# Patient Record
Sex: Female | Born: 1955 | Race: White | Hispanic: No | Marital: Married | Smoking: Former smoker
Health system: Southern US, Community
[De-identification: ages and names within clinical notes are randomized; demographics above are authoritative.]

## PROBLEM LIST (undated history)

## (undated) DIAGNOSIS — R112 Nausea with vomiting, unspecified: Secondary | ICD-10-CM

## (undated) DIAGNOSIS — K2950 Unspecified chronic gastritis without bleeding: Secondary | ICD-10-CM

## (undated) DIAGNOSIS — J189 Pneumonia, unspecified organism: Secondary | ICD-10-CM

## (undated) DIAGNOSIS — K449 Diaphragmatic hernia without obstruction or gangrene: Secondary | ICD-10-CM

## (undated) DIAGNOSIS — R110 Nausea: Secondary | ICD-10-CM

## (undated) DIAGNOSIS — F419 Anxiety disorder, unspecified: Secondary | ICD-10-CM

## (undated) DIAGNOSIS — T8859XA Other complications of anesthesia, initial encounter: Secondary | ICD-10-CM

## (undated) DIAGNOSIS — K649 Unspecified hemorrhoids: Secondary | ICD-10-CM

## (undated) DIAGNOSIS — I499 Cardiac arrhythmia, unspecified: Secondary | ICD-10-CM

## (undated) DIAGNOSIS — K648 Other hemorrhoids: Secondary | ICD-10-CM

## (undated) DIAGNOSIS — F411 Generalized anxiety disorder: Secondary | ICD-10-CM

## (undated) DIAGNOSIS — I209 Angina pectoris, unspecified: Secondary | ICD-10-CM

## (undated) DIAGNOSIS — R002 Palpitations: Secondary | ICD-10-CM

## (undated) DIAGNOSIS — T7840XA Allergy, unspecified, initial encounter: Secondary | ICD-10-CM

## (undated) DIAGNOSIS — T4145XA Adverse effect of unspecified anesthetic, initial encounter: Secondary | ICD-10-CM

## (undated) DIAGNOSIS — I10 Essential (primary) hypertension: Secondary | ICD-10-CM

## (undated) DIAGNOSIS — R0602 Shortness of breath: Secondary | ICD-10-CM

## (undated) DIAGNOSIS — J40 Bronchitis, not specified as acute or chronic: Secondary | ICD-10-CM

## (undated) DIAGNOSIS — Z9889 Other specified postprocedural states: Secondary | ICD-10-CM

## (undated) DIAGNOSIS — K219 Gastro-esophageal reflux disease without esophagitis: Secondary | ICD-10-CM

## (undated) HISTORY — DX: Palpitations: R00.2

## (undated) HISTORY — DX: Unspecified hemorrhoids: K64.9

## (undated) HISTORY — DX: Generalized anxiety disorder: F41.1

## (undated) HISTORY — DX: Allergy, unspecified, initial encounter: T78.40XA

## (undated) HISTORY — DX: Diaphragmatic hernia without obstruction or gangrene: K44.9

## (undated) HISTORY — PX: COLONOSCOPY: SHX174

## (undated) HISTORY — DX: Nausea: R11.0

## (undated) HISTORY — PX: ABDOMINAL HYSTERECTOMY: SHX81

## (undated) HISTORY — PX: OOPHORECTOMY: SHX86

## (undated) HISTORY — DX: Unspecified chronic gastritis without bleeding: K29.50

## (undated) HISTORY — DX: Gastro-esophageal reflux disease without esophagitis: K21.9

## (undated) HISTORY — DX: Other hemorrhoids: K64.8

---

## 1979-06-16 HISTORY — PX: DILATION AND CURETTAGE OF UTERUS: SHX78

## 1987-09-29 ENCOUNTER — Encounter (INDEPENDENT_AMBULATORY_CARE_PROVIDER_SITE_OTHER): Payer: Self-pay | Admitting: Gastroenterology

## 1989-06-15 HISTORY — PX: NASAL SEPTUM SURGERY: SHX37

## 2000-03-07 ENCOUNTER — Encounter: Payer: Self-pay | Admitting: Obstetrics and Gynecology

## 2000-03-07 ENCOUNTER — Encounter: Admission: RE | Admit: 2000-03-07 | Discharge: 2000-03-07 | Payer: Self-pay | Admitting: Obstetrics and Gynecology

## 2001-03-11 ENCOUNTER — Encounter: Payer: Self-pay | Admitting: Obstetrics and Gynecology

## 2001-03-11 ENCOUNTER — Encounter: Admission: RE | Admit: 2001-03-11 | Discharge: 2001-03-11 | Payer: Self-pay | Admitting: Obstetrics and Gynecology

## 2002-07-17 ENCOUNTER — Encounter: Admission: RE | Admit: 2002-07-17 | Discharge: 2002-07-17 | Payer: Self-pay | Admitting: Obstetrics and Gynecology

## 2002-07-17 ENCOUNTER — Encounter: Payer: Self-pay | Admitting: Obstetrics and Gynecology

## 2003-07-22 ENCOUNTER — Encounter: Admission: RE | Admit: 2003-07-22 | Discharge: 2003-07-22 | Payer: Self-pay | Admitting: Obstetrics and Gynecology

## 2003-07-22 ENCOUNTER — Encounter: Payer: Self-pay | Admitting: Obstetrics and Gynecology

## 2004-10-25 ENCOUNTER — Ambulatory Visit: Payer: Self-pay | Admitting: Gastroenterology

## 2004-10-26 ENCOUNTER — Ambulatory Visit: Payer: Self-pay | Admitting: Gastroenterology

## 2004-11-15 ENCOUNTER — Ambulatory Visit (HOSPITAL_COMMUNITY): Admission: RE | Admit: 2004-11-15 | Discharge: 2004-11-15 | Payer: Self-pay | Admitting: Obstetrics and Gynecology

## 2006-01-23 ENCOUNTER — Ambulatory Visit (HOSPITAL_COMMUNITY): Admission: RE | Admit: 2006-01-23 | Discharge: 2006-01-23 | Payer: Self-pay | Admitting: Obstetrics and Gynecology

## 2006-02-05 ENCOUNTER — Encounter: Admission: RE | Admit: 2006-02-05 | Discharge: 2006-02-05 | Payer: Self-pay | Admitting: Obstetrics and Gynecology

## 2007-03-06 ENCOUNTER — Ambulatory Visit: Payer: Self-pay | Admitting: Internal Medicine

## 2007-03-06 LAB — CONVERTED CEMR LAB
ALT: 29 units/L (ref 0–40)
AST: 22 units/L (ref 0–37)
Albumin: 4.6 g/dL (ref 3.5–5.2)
Alkaline Phosphatase: 65 units/L (ref 39–117)
BUN: 18 mg/dL (ref 6–23)
Basophils Absolute: 0 10*3/uL (ref 0.0–0.1)
Basophils Relative: 0.8 % (ref 0.0–1.0)
Bilirubin, Direct: 0.1 mg/dL (ref 0.0–0.3)
CO2: 30 meq/L (ref 19–32)
Calcium: 9.6 mg/dL (ref 8.4–10.5)
Chloride: 107 meq/L (ref 96–112)
Creatinine, Ser: 0.8 mg/dL (ref 0.4–1.2)
Eosinophils Absolute: 0 10*3/uL (ref 0.0–0.6)
Eosinophils Relative: 1.2 % (ref 0.0–5.0)
GFR calc Af Amer: 97 mL/min
GFR calc non Af Amer: 80 mL/min
Glucose, Bld: 103 mg/dL — ABNORMAL HIGH (ref 70–99)
HCT: 41 % (ref 36.0–46.0)
Hemoglobin: 13.9 g/dL (ref 12.0–15.0)
Lymphocytes Relative: 32.9 % (ref 12.0–46.0)
MCHC: 33.8 g/dL (ref 30.0–36.0)
MCV: 85.1 fL (ref 78.0–100.0)
Monocytes Absolute: 0.3 10*3/uL (ref 0.2–0.7)
Monocytes Relative: 7.5 % (ref 3.0–11.0)
Neutro Abs: 2.3 10*3/uL (ref 1.4–7.7)
Neutrophils Relative %: 57.6 % (ref 43.0–77.0)
Platelets: 197 10*3/uL (ref 150–400)
Potassium: 4.4 meq/L (ref 3.5–5.1)
RBC: 4.82 M/uL (ref 3.87–5.11)
RDW: 12.6 % (ref 11.5–14.6)
Sodium: 143 meq/L (ref 135–145)
TSH: 2.11 microintl units/mL (ref 0.35–5.50)
Total Bilirubin: 0.6 mg/dL (ref 0.3–1.2)
Total Protein: 7.8 g/dL (ref 6.0–8.3)
WBC: 3.9 10*3/uL — ABNORMAL LOW (ref 4.5–10.5)

## 2007-03-07 ENCOUNTER — Ambulatory Visit: Payer: Self-pay | Admitting: Internal Medicine

## 2007-06-11 ENCOUNTER — Ambulatory Visit (HOSPITAL_COMMUNITY): Admission: RE | Admit: 2007-06-11 | Discharge: 2007-06-11 | Payer: Self-pay | Admitting: Obstetrics and Gynecology

## 2007-10-16 LAB — CONVERTED CEMR LAB: Pap Smear: NORMAL

## 2008-02-19 ENCOUNTER — Emergency Department (HOSPITAL_COMMUNITY): Admission: EM | Admit: 2008-02-19 | Discharge: 2008-02-20 | Payer: Self-pay | Admitting: Emergency Medicine

## 2008-03-03 ENCOUNTER — Ambulatory Visit: Payer: Self-pay | Admitting: Internal Medicine

## 2008-03-03 DIAGNOSIS — I10 Essential (primary) hypertension: Secondary | ICD-10-CM

## 2008-03-03 DIAGNOSIS — J309 Allergic rhinitis, unspecified: Secondary | ICD-10-CM | POA: Insufficient documentation

## 2008-03-03 HISTORY — DX: Essential (primary) hypertension: I10

## 2008-03-03 HISTORY — DX: Allergic rhinitis, unspecified: J30.9

## 2008-03-04 ENCOUNTER — Telehealth: Payer: Self-pay | Admitting: Internal Medicine

## 2008-03-04 ENCOUNTER — Ambulatory Visit: Payer: Self-pay | Admitting: Internal Medicine

## 2008-03-25 ENCOUNTER — Emergency Department (HOSPITAL_COMMUNITY): Admission: EM | Admit: 2008-03-25 | Discharge: 2008-03-26 | Payer: Self-pay | Admitting: Emergency Medicine

## 2008-03-26 ENCOUNTER — Ambulatory Visit: Payer: Self-pay | Admitting: Internal Medicine

## 2008-04-02 ENCOUNTER — Ambulatory Visit: Payer: Self-pay | Admitting: Internal Medicine

## 2008-04-05 ENCOUNTER — Telehealth: Payer: Self-pay | Admitting: Internal Medicine

## 2008-04-07 ENCOUNTER — Telehealth: Payer: Self-pay | Admitting: Internal Medicine

## 2008-04-30 ENCOUNTER — Telehealth: Payer: Self-pay | Admitting: Internal Medicine

## 2008-12-16 ENCOUNTER — Observation Stay (HOSPITAL_COMMUNITY): Admission: EM | Admit: 2008-12-16 | Discharge: 2008-12-17 | Payer: Self-pay | Admitting: Emergency Medicine

## 2008-12-16 ENCOUNTER — Ambulatory Visit: Payer: Self-pay | Admitting: Internal Medicine

## 2008-12-23 ENCOUNTER — Ambulatory Visit: Payer: Self-pay

## 2008-12-30 ENCOUNTER — Ambulatory Visit: Payer: Self-pay | Admitting: Internal Medicine

## 2008-12-30 ENCOUNTER — Encounter: Payer: Self-pay | Admitting: Internal Medicine

## 2009-02-02 ENCOUNTER — Encounter: Payer: Self-pay | Admitting: Internal Medicine

## 2009-02-02 ENCOUNTER — Ambulatory Visit: Payer: Self-pay | Admitting: Internal Medicine

## 2009-02-02 ENCOUNTER — Ambulatory Visit (HOSPITAL_COMMUNITY): Admission: RE | Admit: 2009-02-02 | Discharge: 2009-02-02 | Payer: Self-pay | Admitting: Obstetrics and Gynecology

## 2009-02-02 DIAGNOSIS — R002 Palpitations: Secondary | ICD-10-CM | POA: Insufficient documentation

## 2009-02-02 HISTORY — DX: Palpitations: R00.2

## 2009-02-07 ENCOUNTER — Telehealth: Payer: Self-pay | Admitting: Internal Medicine

## 2009-02-08 ENCOUNTER — Telehealth: Payer: Self-pay | Admitting: Internal Medicine

## 2009-02-09 ENCOUNTER — Telehealth: Payer: Self-pay | Admitting: Internal Medicine

## 2009-05-12 ENCOUNTER — Telehealth: Payer: Self-pay | Admitting: Internal Medicine

## 2009-05-26 ENCOUNTER — Ambulatory Visit (HOSPITAL_COMMUNITY): Admission: RE | Admit: 2009-05-26 | Discharge: 2009-05-26 | Payer: Self-pay | Admitting: Internal Medicine

## 2009-06-16 ENCOUNTER — Ambulatory Visit: Payer: Self-pay | Admitting: Internal Medicine

## 2009-06-16 ENCOUNTER — Telehealth: Payer: Self-pay | Admitting: Internal Medicine

## 2009-06-16 DIAGNOSIS — K649 Unspecified hemorrhoids: Secondary | ICD-10-CM | POA: Insufficient documentation

## 2009-06-16 DIAGNOSIS — R1011 Right upper quadrant pain: Secondary | ICD-10-CM

## 2009-06-16 DIAGNOSIS — R110 Nausea: Secondary | ICD-10-CM

## 2009-06-16 HISTORY — DX: Right upper quadrant pain: R10.11

## 2009-06-16 HISTORY — DX: Nausea: R11.0

## 2009-06-16 HISTORY — DX: Unspecified hemorrhoids: K64.9

## 2009-06-17 LAB — CONVERTED CEMR LAB
ALT: 22 units/L (ref 0–35)
AST: 16 units/L (ref 0–37)
Albumin: 4.6 g/dL (ref 3.5–5.2)
Alkaline Phosphatase: 65 units/L (ref 39–117)
Basophils Absolute: 0 10*3/uL (ref 0.0–0.1)
Basophils Relative: 0.6 % (ref 0.0–3.0)
Bilirubin, Direct: 0.1 mg/dL (ref 0.0–0.3)
Eosinophils Absolute: 0.1 10*3/uL (ref 0.0–0.7)
Eosinophils Relative: 1.3 % (ref 0.0–5.0)
HCT: 37.8 % (ref 36.0–46.0)
Hemoglobin: 13.3 g/dL (ref 12.0–15.0)
Lymphocytes Relative: 26 % (ref 12.0–46.0)
Lymphs Abs: 1.2 10*3/uL (ref 0.7–4.0)
MCHC: 35.3 g/dL (ref 30.0–36.0)
MCV: 88.1 fL (ref 78.0–100.0)
Monocytes Absolute: 0.3 10*3/uL (ref 0.1–1.0)
Monocytes Relative: 5.6 % (ref 3.0–12.0)
Neutro Abs: 2.9 10*3/uL (ref 1.4–7.7)
Neutrophils Relative %: 66.5 % (ref 43.0–77.0)
Platelets: 184 10*3/uL (ref 150.0–400.0)
RBC: 4.29 M/uL (ref 3.87–5.11)
RDW: 12.1 % (ref 11.5–14.6)
Total Bilirubin: 0.9 mg/dL (ref 0.3–1.2)
Total Protein: 7.7 g/dL (ref 6.0–8.3)
WBC: 4.5 10*3/uL (ref 4.5–10.5)

## 2009-06-23 ENCOUNTER — Ambulatory Visit: Payer: Self-pay | Admitting: Internal Medicine

## 2009-06-23 ENCOUNTER — Encounter: Payer: Self-pay | Admitting: Internal Medicine

## 2009-06-27 ENCOUNTER — Encounter: Payer: Self-pay | Admitting: Internal Medicine

## 2009-07-28 ENCOUNTER — Encounter (INDEPENDENT_AMBULATORY_CARE_PROVIDER_SITE_OTHER): Payer: Self-pay | Admitting: *Deleted

## 2009-11-01 ENCOUNTER — Telehealth: Payer: Self-pay | Admitting: Internal Medicine

## 2009-11-03 ENCOUNTER — Ambulatory Visit: Payer: Self-pay | Admitting: Internal Medicine

## 2009-11-03 LAB — CONVERTED CEMR LAB
ALT: 16 units/L (ref 0–35)
AST: 15 units/L (ref 0–37)
Albumin: 4.5 g/dL (ref 3.5–5.2)
Alkaline Phosphatase: 57 units/L (ref 39–117)
BUN: 20 mg/dL (ref 6–23)
Basophils Absolute: 0 10*3/uL (ref 0.0–0.1)
Basophils Relative: 0.3 % (ref 0.0–3.0)
Bilirubin, Direct: 0.1 mg/dL (ref 0.0–0.3)
Creatinine, Ser: 0.9 mg/dL (ref 0.4–1.2)
Eosinophils Absolute: 0.1 10*3/uL (ref 0.0–0.7)
Eosinophils Relative: 2.3 % (ref 0.0–5.0)
HCT: 36.1 % (ref 36.0–46.0)
Hemoglobin: 11.9 g/dL — ABNORMAL LOW (ref 12.0–15.0)
Lymphocytes Relative: 28.4 % (ref 12.0–46.0)
Lymphs Abs: 1.1 10*3/uL (ref 0.7–4.0)
MCHC: 32.9 g/dL (ref 30.0–36.0)
MCV: 89.2 fL (ref 78.0–100.0)
Monocytes Absolute: 0.4 10*3/uL (ref 0.1–1.0)
Monocytes Relative: 8.9 % (ref 3.0–12.0)
Neutro Abs: 2.4 10*3/uL (ref 1.4–7.7)
Neutrophils Relative %: 60.1 % (ref 43.0–77.0)
Platelets: 169 10*3/uL (ref 150.0–400.0)
RBC: 4.04 M/uL (ref 3.87–5.11)
RDW: 12.4 % (ref 11.5–14.6)
Total Bilirubin: 0.8 mg/dL (ref 0.3–1.2)
Total Protein: 7.4 g/dL (ref 6.0–8.3)
WBC: 4 10*3/uL — ABNORMAL LOW (ref 4.5–10.5)

## 2009-11-04 ENCOUNTER — Ambulatory Visit: Payer: Self-pay | Admitting: Cardiology

## 2009-11-07 ENCOUNTER — Ambulatory Visit: Payer: Self-pay | Admitting: Internal Medicine

## 2009-12-05 ENCOUNTER — Encounter: Payer: Self-pay | Admitting: Internal Medicine

## 2009-12-07 ENCOUNTER — Ambulatory Visit: Payer: Self-pay | Admitting: Internal Medicine

## 2009-12-07 DIAGNOSIS — R079 Chest pain, unspecified: Secondary | ICD-10-CM | POA: Insufficient documentation

## 2009-12-07 HISTORY — DX: Chest pain, unspecified: R07.9

## 2009-12-15 ENCOUNTER — Ambulatory Visit: Payer: Self-pay | Admitting: Internal Medicine

## 2010-01-11 ENCOUNTER — Telehealth: Payer: Self-pay | Admitting: Internal Medicine

## 2010-02-07 ENCOUNTER — Ambulatory Visit (HOSPITAL_COMMUNITY): Admission: RE | Admit: 2010-02-07 | Discharge: 2010-02-07 | Payer: Self-pay | Admitting: Obstetrics and Gynecology

## 2010-04-04 ENCOUNTER — Ambulatory Visit: Payer: Self-pay | Admitting: Internal Medicine

## 2010-04-04 DIAGNOSIS — IMO0001 Reserved for inherently not codable concepts without codable children: Secondary | ICD-10-CM | POA: Insufficient documentation

## 2010-04-04 DIAGNOSIS — M791 Myalgia, unspecified site: Secondary | ICD-10-CM

## 2010-04-04 HISTORY — DX: Myalgia, unspecified site: M79.10

## 2010-10-24 ENCOUNTER — Ambulatory Visit: Admit: 2010-10-24 | Payer: Self-pay | Admitting: Internal Medicine

## 2010-11-05 ENCOUNTER — Encounter: Payer: Self-pay | Admitting: Internal Medicine

## 2010-11-05 ENCOUNTER — Encounter: Payer: Self-pay | Admitting: Obstetrics and Gynecology

## 2010-11-14 NOTE — Assessment & Plan Note (Signed)
   Allergies: 1)  ! Sulfamethoxazole (Sulfamethoxazole)  Vital Signs:  Patient profile:   55 year old female Height:      64 inches Weight:      155 pounds BMI:     26.70 Pulse rate:   77 / minute Resp:     18 per minute BP sitting:   168 / 88  (left arm)  Vitals Entered By: Marrion Coy, CNA (December 30, 2008 11:33 AM)

## 2010-11-14 NOTE — Assessment & Plan Note (Signed)
Summary: ?tick bite/head hurting/stiff neck/body aches/nausea/cjr   Vital Signs:  Patient profile:   55 year old female Weight:      168 pounds Temp:     97.8 degrees F oral BP sitting:   160 / 90  (left arm) Cuff size:   regular  Vitals Entered By: Duard Brady LPN (April 04, 2010 10:27 AM) CC: c/o head pain, neck pain - ?tick bite? Is Patient Diabetic? No   Primary Care Provider:  Birdie Sons, MD  CC:  c/o head pain and neck pain - ?tick bite?Marland Kitchen  History of Present Illness: 55 year old patient who had a tick exposure approximately 2 weeks ago.  Last week she developed a headache, neck pain, generalized myalgias, weakness and fatigue.  She has treated hypertension is been no documented fever or chills.  She still is achy and feels unwell.  Preventive Screening-Counseling & Management  Alcohol-Tobacco     Smoking Status: never  Allergies: 1)  ! Sulfamethoxazole (Sulfamethoxazole)  Social History: Smoking Status:  never  Review of Systems       The patient complains of anorexia, headaches, and muscle weakness.  The patient denies fever, weight loss, weight gain, vision loss, decreased hearing, hoarseness, chest pain, syncope, dyspnea on exertion, peripheral edema, prolonged cough, hemoptysis, abdominal pain, melena, hematochezia, severe indigestion/heartburn, hematuria, incontinence, genital sores, suspicious skin lesions, transient blindness, difficulty walking, depression, unusual weight change, abnormal bleeding, enlarged lymph nodes, angioedema, and breast masses.    Physical Exam  General:  Well-developed,well-nourished,in no acute distress; alert,appropriate and cooperative throughout examination; repeat blood pressure down to 120/82 Head:  Normocephalic and atraumatic without obvious abnormalities. No apparent alopecia or balding. Eyes:  No corneal or conjunctival inflammation noted. EOMI. Perrla. Funduscopic exam benign, without hemorrhages, exudates or  papilledema. Vision grossly normal. Ears:  External ear exam shows no significant lesions or deformities.  Otoscopic examination reveals clear canals, tympanic membranes are intact bilaterally without bulging, retraction, inflammation or discharge. Hearing is grossly normal bilaterally. Mouth:  Oral mucosa and oropharynx without lesions or exudates.  Teeth in good repair. Neck:  No deformities, masses, or tenderness noted. Lungs:  Normal respiratory effort, chest expands symmetrically. Lungs are clear to auscultation, no crackles or wheezes. Heart:  Normal rate and regular rhythm. S1 and S2 normal without gallop, murmur, click, rub or other extra sounds. Abdomen:  Bowel sounds positive,abdomen soft and non-tender without masses, organomegaly or hernias noted.   Impression & Recommendations:  Problem # 1:  TICK BITE (ICD-E906.4)  Problem # 2:  MYALGIA (ICD-729.1)  Her updated medication list for this problem includes:    Aspirin 81 Mg Tbec (Aspirin) .Marland Kitchen... Take one by mouth as needed in view of her tick exposure, Will treat with doxycycline for 10 days  Complete Medication List: 1)  Micardis 80 Mg Tabs (Telmisartan) .... Take 1 tablet by mouth once a day 2)  Labetalol Hcl 200 Mg Tabs (Labetalol hcl) .... Take 1 tablet by mouth two times a day 3)  Amlodipine Besylate 5 Mg Tabs (Amlodipine besylate) .... Take 1 by mouth once daily 4)  Aspirin 81 Mg Tbec (Aspirin) .... Take one by mouth as needed 5)  Zyrtec Allergy 10 Mg Tabs (Cetirizine hcl) .... As needed 6)  Prilosec 20 Mg Cpdr (Omeprazole) .... Take 1 tablet by mouth once a day 7)  Nitrostat 0.4 Mg Subl (Nitroglycerin) .... Take as direcred 8)  Doxycycline Hyclate 100 Mg Tabs (Doxycycline hyclate) .... One twice daily  Patient Instructions: 1)  Take your antibiotic  as prescribed until ALL of it is gone, but stop if you develop a rash or swelling and contact our office as soon as possible. 2)  call if there is worsening fever, or any  worsening in your clinical status Prescriptions: DOXYCYCLINE HYCLATE 100 MG TABS (DOXYCYCLINE HYCLATE) one twice daily  #20 x 0   Entered and Authorized by:   Gordy Savers  MD   Signed by:   Gordy Savers  MD on 04/04/2010   Method used:   Electronically to        [facility]. [phone]* (retail)       [phone]       [place], [place]  [address]       Ph: [phone]       Fax: [phone]   RxID:   [id number]

## 2010-11-14 NOTE — Letter (Signed)
Summary: Patient Notice-Endo Biopsy Results  [facility]  [address] [place], [place] [address]   Phone: [phone]  Fax: [phone]        June 27, 2009 MRN: [id number]    [facility] [address] [place], [place]  [address]    Dear Ms. Sheri Martinez,  I am pleased to inform you that the biopsies taken during your recent endoscopic examination did not show any evidence of cancer upon pathologic examination.The tissue from the stomach showed mild inflammation  Additional information/recommendations:  __No further action is needed at this time.  Please follow-up with      your primary care physician for your other healthcare needs.  __ Please call [phone] to schedule a return visit to review      your condition.  __x Continue with the treatment plan as outlined on the day of your      exam.     Please call us if you are having persistent problems or have questions about your condition that have not been fully answered at this time.  Sincerely,  Hart Carwin MD  This letter has been electronically signed by your physician.

## 2010-11-14 NOTE — Progress Notes (Signed)
Summary: pt need a call back  Phone Note From Pharmacy Call back at [phone]   Caller: [facility] Request: Speak with Nurse Summary of Call: amlodpine 5mg  cal pt have #90 instead #30 UJ#[id number] Initial call taken by: Faythe Ghee,  February 09, 2009 11:33 AM  Follow-up for Phone Call        returning a call received last night. plc call pt at [phone] fixed in system #90 w/ 3 refills [name], RN, BSN  February 11, 2009 11:15 AM Follow-up by: Sydell Axon,  February 10, 2009 12:21 PM      Prescriptions: AMLODIPINE BESYLATE 5 MG TABS (AMLODIPINE BESYLATE) Take 1 by mouth once daily  #90 x 3   Entered by:   [name], RN, BSN   Authorized by:   Laren Boom, MD, [facility]   Signed by:   [name], RN, BSN on 02/11/2009   Method used:   Electronically to        [facility]* (mail-order)             ,          Ph: [phone]       Fax: [phone]   RxID:   [id number]

## 2010-11-14 NOTE — Progress Notes (Signed)
Summary: TRIAGE-CT & Labs ordered  Phone Note Call from Patient Call back at Home Phone [phone]   Caller: Patient Call For: Dr. Juanda Chance Reason for Call: Talk to Nurse Summary of Call: pt said she is to report when her symptom returned, right side abd pain... last week was particularly painful Initial call taken by: Vallarie Mare,  November 01, 2009 9:29 AM  Follow-up for Phone Call          Last OV was 06-16-09 and Endo. 06-23-09.  Pt. is calling today with c/o an episode of RUQ pain last week. She never really felt good since Endo. but she got worse last week. "It felt like a heart attack, the pain was severe" , pain was RUQ, went up to her shoulders,she was bloated, she was "swollen and irritated on that right side", it didn't feel right at all.  Didn't use Levsin. Takes Nexium once daily.   Currently she is having RUQ discomfort, nausea if she moves, some bloating. Denies fever, constipation, diarrhea, blood, black stools.   1) If you have another "Attack" go directly to the ER. 2) Soft,bland diet. No spicy,greasy,fried foods. 3) Begin Levsin two times a day as needed 4) Continue Nexium once daily. May use Tums, Mylanta,etc as needed. 5) Gas-x,Phazyme, etc. as needed for gas & bloating. 6) I will call pt., with new orders, after MD reviews.  DR.BRODIE PLEASE ADVISE   Follow-up by: Laureen Ochs LPN,  November 01, 2009 10:27 AM  Additional Follow-up for Phone Call Additional follow up Details #1::        According to my OV note the next step  is a CT scan of the abdomen and pelvis with IV and oral contrast. Her HIDA was normal. Also, repeat CBC,LFT's. Additional Follow-up by: Hart Carwin MD,  November 02, 2009 1:41 PM    Additional Follow-up for Phone Call Additional follow up Details #2::    Above MD orders reviewed with patient. Pt. states she does feel better today. She will have labs drawn on 11-03-09 and will pick-up CT contrast and instructions. CT is scheduled for  11-04-09 at 2:30pm at [facility] CT. Instructions reviewed w/pt. by phone. Pt. instructed to call back as needed.  Follow-up by: Laureen Ochs LPN,  November 02, 2009 3:21 PM

## 2010-11-14 NOTE — Assessment & Plan Note (Signed)
Summary: per check out/sf   Referring Provider:  n/a Primary Provider:  Birdie Sons, MD   History of Present Illness: Sheri Martinez returns today for follow-up of refractory hypertension and c/p and palpitations.  She was hospitalized several months ago with C/P and r/o for MI.  She had a negative stress test.  Over the past several weeks, she has had spells where her blood pressure has been elevated and quite labile.  She records episodes where her pressure will be over 180 and several minutes later, go back down to the 90's.  She has chest pressure and abdominal discomfort.  Current Medications (verified): 1)  Micardis 80 Mg  Tabs (Telmisartan) .... Take 1 Tablet By Mouth Once A Day 2)  Labetalol Hcl 200 Mg Tabs (Labetalol Hcl) .... Take 1 Tablet By Mouth Two Times A Day 3)  Amlodipine Besylate 5 Mg Tabs (Amlodipine Besylate) .... Take 1 By Mouth Once Daily 4)  Aspirin 81 Mg Tbec (Aspirin) .... Take One By Mouth As Needed 5)  Zyrtec Allergy 10 Mg Tabs (Cetirizine Hcl) .... As Needed 6)  Prilosec 20 Mg Cpdr (Omeprazole) .... Take 1 Tablet By Mouth Once A Day  Allergies (verified): 1)  ! Sulfamethoxazole (Sulfamethoxazole)  Past History:  Past Medical History: Last updated: 06/16/2009 ABDOMINAL PAIN, RIGHT UPPER QUADRANT (ICD-789.01) NAUSEA (ICD-787.02) HEMORRHOIDS (ICD-455.6) PALPITATIONS (ICD-785.1) HYPERTENSION (ICD-401.9) ALLERGIC RHINITIS (ICD-477.9)    Past Surgical History: Last updated: 03/03/2008 Hysterectomy-emdometriosis deviated septum repair Oophorectomy--(tah/bso) (off estrogen 2005)  Review of Systems       The patient complains of chest pain.  The patient denies syncope, dyspnea on exertion, and peripheral edema.    Vital Signs:  Patient profile:   55 year old female Height:      64 inches Weight:      161 pounds BMI:     27.74 Pulse rate:   80 / minute Resp:     16 per minute BP sitting:   180 / 96  (left arm)  Vitals Entered By: Marrion Coy, CNA  (December 07, 2009 2:07 PM)  Physical Exam  General:  Well developed, well nourished, no acute distress. Head:  normocephalic and atraumatic Eyes:  PERRLA, no icterus. Mouth:  No deformity or lesions, dentition normal. Neck:  Supple; no masses or thyromegaly. Lungs:  Clear throughout to auscultation. No wheezes, rales, or rhonchi. Heart:  Regular rate and rhythm; no murmurs, rubs,  or bruits. Abdomen:  tender right upper quadrant and along the right costal margin, no rebound. Liver edge at costal margin. No rub. Bowel sounds are normal active. Epigastric tenderness. No pulsations. Lower abdomen is unremarkable. Msk:  Back normal, normal gait. Muscle strength and tone normal. Pulses:  pulses normal in all 4 extremities Extremities:  No clubbing, cyanosis, edema or deformities noted. Neurologic:  cranial nerves II-XII intact and gait normal.     EKG  Procedure date:  12/07/2009  Findings:      Normal sinus rhythm with rate of:  80.  Impression & Recommendations:  Problem # 1:  CHEST PAIN UNSPECIFIED (ICD-786.50) The etiology of her symptoms is unclear.  She had a negative stress test a year ago.  I am still concerned about occult CAD.  I have recommended she use slNTG as needed for c/p. I will repeat a regular stress test. Her updated medication list for this problem includes:    Labetalol Hcl 200 Mg Tabs (Labetalol hcl) .Marland Kitchen... Take 1 tablet by mouth two times a day    Amlodipine  Besylate 5 Mg Tabs (Amlodipine besylate) .Marland Kitchen... Take 1 by mouth once daily    Aspirin 81 Mg Tbec (Aspirin) .Marland Kitchen... Take one by mouth as needed    Nitrostat 0.4 Mg Subl (Nitroglycerin) .Marland Kitchen... Take as direcred  Orders: Treadmill (Treadmill)  Problem # 2:  PALPITATIONS (ICD-785.1) The etiology is unclear at this point.  I will consider a cardiac monitor if her symptoms worsen. Her updated medication list for this problem includes:    Labetalol Hcl 200 Mg Tabs (Labetalol hcl) .Marland Kitchen... Take 1 tablet by mouth two  times a day    Amlodipine Besylate 5 Mg Tabs (Amlodipine besylate) .Marland Kitchen... Take 1 by mouth once daily    Aspirin 81 Mg Tbec (Aspirin) .Marland Kitchen... Take one by mouth as needed    Nitrostat 0.4 Mg Subl (Nitroglycerin) .Marland Kitchen... Take as direcred  Problem # 3:  HYPERTENSION (ICD-401.9) Her pressures are quite labile.  Will followup in several months.  I have not asked her to increase her medications yet. Her updated medication list for this problem includes:    Micardis 80 Mg Tabs (Telmisartan) .Marland Kitchen... Take 1 tablet by mouth once a day    Labetalol Hcl 200 Mg Tabs (Labetalol hcl) .Marland Kitchen... Take 1 tablet by mouth two times a day    Amlodipine Besylate 5 Mg Tabs (Amlodipine besylate) .Marland Kitchen... Take 1 by mouth once daily    Aspirin 81 Mg Tbec (Aspirin) .Marland Kitchen... Take one by mouth as needed  Patient Instructions: 1)  Your physician has requested that you have an exercise tolerance test.  For further information please visit [email or web address]  Please also follow instruction sheet, as given. Prescriptions: NITROSTAT 0.4 MG SUBL (NITROGLYCERIN) take as direcred  #50 x 1   Entered by:   [name], RN, BSN   Authorized by:   Laren Boom, MD, [facility]   Signed by:   [name], RN, BSN on 12/07/2009   Method used:   Electronically to        [facility]. [phone]* (retail)       [phone]       [place], [place]  [address]       Ph: [phone]       Fax: [phone]   RxID:   [id number]

## 2010-11-14 NOTE — Progress Notes (Signed)
Summary: REFILL MEDS  Phone Note Refill Request Call back at Home Phone [phone] Message from:  Patient on February 08, 2009 12:05 PM  Refills Requested: Medication #1[name] [phone] REF# [id number]/ DON'T KNOW WHICH MEDS WAS CALLING IN WAITING ON 3 RECEIVE LAB  Initial call taken by: Lorne Skeens,  February 08, 2009 12:06 PM  Follow-up for Phone Call        [facility] called pt and let her know what three meds had been called in. [name], RN, BSN  February 10, 2009 12:02 PM

## 2010-11-14 NOTE — Assessment & Plan Note (Signed)
Summary: F/U CT AND CONTINUED PAIN         Sheri Martinez   History of Present Illness Visit Type: Follow-up Visit Primary GI MD: Lina Sar MD Primary Provider: Birdie Sons, MD Requesting Provider: n/a Chief Complaint: Patient states that she is here to follow up after her CT scan. She states that her pain is much better than it was. She is still just a little tender under her right rib. History of Present Illness:   Sheri Martinez is a very nice 55 year old white female whom we have seen in the past for evaluation of intermittent right upper quadrant abdominal pain. Dr. Corinda Gubler did an upper endoscopy on her in 1985 for gastroesophageal reflux. She has a positive family history of gallbladder disease in 2 of her maternal aunts, but several upper abdominal ultrasounds in the past have been negative; the most recent one in 2008. A HIDA scan was completed on 05/26/09 after the patient called complaining of abdominal pain. The scan showed adequate gallbladder contraction and progressive small bowel activity. The gallbladder ejection fraction was calculated at approximately 30 minutes was 43.8%. A normal ejection fraction is considered greater than 30%. There is no family history of colon cancer and no family history of peptic ulcer disease or inflammatory bowel disease. Patient comes today for a follow up of her abdominal pain. She states that the right upper quadrant abdominal pain has become worse and is now associated with nausea. She denies any upper gastrointestinal problems or any change in bowel habits. She took a course of anti-inflammatories for 3 weeks due to some recent back pain. Her symptoms started afterward. An upper endoscopy in 1985 showed a 2 cm hiatal hernia and mild stricture with esophagitis.    GI Review of Systems      Denies abdominal pain, acid reflux, belching, bloating, chest pain, dysphagia with liquids, dysphagia with solids, heartburn, loss of appetite, nausea, vomiting, vomiting blood,  weight loss, and  weight gain.        Denies anal fissure, black tarry stools, change in bowel habit, constipation, diarrhea, diverticulosis, fecal incontinence, heme positive stool, hemorrhoids, irritable bowel syndrome, jaundice, light color stool, liver problems, rectal bleeding, and  rectal pain.    Current Medications (verified): 1)  Micardis 80 Mg  Tabs (Telmisartan) .... Take 1 Tablet By Mouth Once A Day 2)  Labetalol Hcl 200 Mg Tabs (Labetalol Hcl) .... Take 1 Tablet By Mouth Two Times A Day 3)  Amlodipine Besylate 5 Mg Tabs (Amlodipine Besylate) .... Take 1 By Mouth Once Daily 4)  Aspirin 81 Mg Tbec (Aspirin) .... Take One By Mouth As Needed 5)  Zyrtec Allergy 10 Mg Tabs (Cetirizine Hcl) .... As Needed 6)  Levbid 0.375 Mg Xr12h-Tab (Hyoscyamine Sulfate) .... Take One By Mouth Every 12 Hours As Needed For Abd. Pain.  Allergies (verified): 1)  ! Sulfamethoxazole (Sulfamethoxazole)  Past History:  Past Medical History: Last updated: 06/16/2009 ABDOMINAL PAIN, RIGHT UPPER QUADRANT (ICD-789.01) NAUSEA (ICD-787.02) HEMORRHOIDS (ICD-455.6) PALPITATIONS (ICD-785.1) HYPERTENSION (ICD-401.9) ALLERGIC RHINITIS (ICD-477.9)    Past Surgical History: Last updated: 03/03/2008 Hysterectomy-emdometriosis deviated septum repair Oophorectomy--(tah/bso) (off estrogen 2005)  Family History: Last updated: 06/16/2009 mother--htn, lipids Family History High cholesterol-mother Family History Hypertension-mother father deceased--general poor health  late 62s Family History of Heart Disease: Grandparents Family History of Ovarian Cancer: Aunt No FH of Colon Cancer:  Social History: Last updated: 03/03/2008 Occupation: [employer] support [employer] Former Smoker Married adopted children Alcohol use-yes Regular exercise-no  Review of Systems  The patient  denies allergy/sinus, anemia, anxiety-new, arthritis/joint pain, back pain, blood in urine, breast changes/lumps, change in vision, confusion,  cough, coughing up blood, depression-new, fainting, fatigue, fever, headaches-new, hearing problems, heart murmur, heart rhythm changes, itching, menstrual pain, muscle pains/cramps, night sweats, nosebleeds, pregnancy symptoms, shortness of breath, skin rash, sleeping problems, sore throat, swelling of feet/legs, swollen lymph glands, thirst - excessive , urination - excessive , urination changes/pain, urine leakage, vision changes, and voice change.         Pertinent positive and negative review of systems were noted in the above HPI. All other ROS was otherwise negative.   Vital Signs:  Patient profile:   55 year old female Height:      64 inches Weight:      163.6 pounds BMI:     28.18 Pulse rate:   88 / minute Pulse rhythm:   regular BP sitting:   162 / 82  (left arm) Cuff size:   regular  Vitals Entered By: Harlow Mares CMA Duncan Dull) (November 07, 2009 11:01 AM)  Physical Exam  General:  Well developed, well nourished, no acute distress. Eyes:  PERRLA, no icterus. Mouth:  No deformity or lesions, dentition normal. Neck:  Supple; no masses or thyromegaly. Lungs:  Clear throughout to auscultation. Heart:  Regular rate and rhythm; no murmurs, rubs,  or bruits. Abdomen:  tender right upper quadrant and along the right costal margin, no rebound. Liver edge at costal margin. No rub. Bowel sounds are normal active. Epigastric tenderness. No pulsations. Lower abdomen is unremarkable. Extremities:  No clubbing, cyanosis, edema or deformities noted. Skin:  Intact without significant lesions or rashes. Psych:  Alert and cooperative. Normal mood and affect.   Impression & Recommendations:  Problem # 1:  ABDOMINAL PAIN, RIGHT UPPER QUADRANT (ICD-789.01) Patient has chronic intermittent right upper quadrant abdominal pain of unclear etiology. A CT Scan shows duodenal inflammation but her upper endoscopy in September 2010 was normal. She has been off her proton pump inhibitor. We will restart  Prilosec 20 mg daily and a low-fat diet. We will also consider repeating a HIDA scan.  Problem # 2:  HEMORRHOIDS (ICD-455.6) Patient is status post colonoscopy 2008.  Patient Instructions: 1)  low-fat diet. 2)  Prilosec 20 mg daily. 3)  If pain recurs, call the office and I would like to see her within 24 hours of her pain. Consider repeating HIDA scan. 4)  Copy sent to : Dr B.Swords Prescriptions: PRILOSEC 20 MG CPDR (OMEPRAZOLE) Take 1 tablet by mouth once a day  #30 x 5   Entered by:   Hortense Ramal CMA (AAMA)   Authorized by:   Hart Carwin MD   Signed by:   Hortense Ramal CMA (AAMA) on 11/07/2009   Method used:   Electronically to        [facility]. [phone]* (retail)       [phone] [address]       [place], [place]  [address]       Ph: [phone]       Fax: [phone]   RxID:   [id number]

## 2010-11-14 NOTE — Assessment & Plan Note (Signed)
Summary: WORSENING RUQ PAIN, NAUSEA               Sheri Martinez   History of Present Illness Visit Type: follow up  Primary GI MD: Lina Sar MD Primary Provider: Birdie Sons, MD Requesting Provider: n/a Chief Complaint: Pt states RUQ pain that is getting worse and nausea History of Present Illness:   Sheri Martinez is a very nice 55 year old white female whom we have seen in the past for evaluation of intermittent right upper quadrant abdominal pain. Dr. Corinda Gubler did an upper endoscopy on her in 1985 for gastroesophageal reflux. She has a positive family history of gallbladder disease in 2 of her maternal aunts, but several upper abdominal ultrasounds in the past have been negative, the most recent one in 2008. A HIDA scan was completed on 05/26/09 after the patient called complaining of abdominal pain. The scan showed adequate gallbladder contraction and progressive small bowel activity. The gallbladder ejection fraction calculated at approximately 30 minutes was 43.8%. Normal ejection fractions are considered greater than 30%. There is no family history of colon cancer and no family history of peptic ulcer disease or inflammatory bowel disease. Patient comes today for follow up of her abdominal pain. She states that the right upper quadrant abdominal pain has become worse and is now associated with nausea. She denies any upper gastrointestinal problems or any change in bowel habits. She took a course of anti-inflammatories for 3 weeks for back pain recently. Her symptoms started afterward. An upper endoscopy in 1985 showed a 2 cm hiatal hernia and mild stricture with esophagitis.   GI Review of Systems    Reports abdominal pain and  nausea.     Location of  Abdominal pain: RUQ.    Denies acid reflux, belching, bloating, chest pain, dysphagia with liquids, dysphagia with solids, heartburn, loss of appetite, vomiting, vomiting blood, weight loss, and  weight gain.        Denies anal fissure, black tarry  stools, change in bowel habit, constipation, diarrhea, diverticulosis, fecal incontinence, heme positive stool, hemorrhoids, irritable bowel syndrome, jaundice, light color stool, liver problems, rectal bleeding, and  rectal pain.    Current Medications (verified): 1)  Micardis 80 Mg  Tabs (Telmisartan) .... Take 1 Tablet By Mouth Once A Day 2)  Labetalol Hcl 200 Mg Tabs (Labetalol Hcl) .... Take 1 Tablet By Mouth Two Times A Day 3)  Amlodipine Besylate 5 Mg Tabs (Amlodipine Besylate) .... Take 1 By Mouth Once Daily 4)  Bayer Aspirin 325 Mg Tabs (Aspirin) .... As Needed 5)  Zyrtec Allergy 10 Mg Tabs (Cetirizine Hcl) .... As Needed 6)  Levbid 0.375 Mg Xr12h-Tab (Hyoscyamine Sulfate) .... Take One By Mouth Every 12 Hours As Needed For Abd. Pain.  Allergies (verified): 1)  ! Sulfamethoxazole (Sulfamethoxazole)  Past History:  Past Medical History: Reviewed history from 04/01/2008 and no changes required. Hypertension Hemorrhoids  Past Surgical History: Reviewed history from 03/03/2008 and no changes required. Hysterectomy-emdometriosis deviated septum repair Oophorectomy--(tah/bso) (off estrogen 2005)  Family History: mother--htn, lipids Family History High cholesterol-mother Family History Hypertension-mother father deceased--general poor health  late 68s No FH of Colon Cancer:  Social History: Reviewed history from 03/03/2008 and no changes required. Occupation: [employer] support [employer] Former Smoker Married adopted children Alcohol use-yes Regular exercise-no  Review of Systems  The patient denies allergy/sinus, anemia, anxiety-new, arthritis/joint pain, back pain, blood in urine, breast changes/lumps, change in vision, confusion, cough, coughing up blood, depression-new, fainting, fatigue, fever, headaches-new, hearing problems, heart murmur, heart  rhythm changes, itching, menstrual pain, muscle pains/cramps, night sweats, nosebleeds, pregnancy symptoms, shortness of breath, skin  rash, sleeping problems, sore throat, swelling of feet/legs, swollen lymph glands, thirst - excessive , urination - excessive , urination changes/pain, urine leakage, vision changes, and voice change.         Pertinent positive and negative review of systems were noted in the above HPI. All other ROS was otherwise negative.   Vital Signs:  Patient profile:   55 year old female Height:      64 inches Weight:      159 pounds BMI:     27.39 BSA:     1.78 Pulse rate:   88 / minute Pulse rhythm:   regular BP sitting:   152 / 98  (left arm) Cuff size:   regular  Vitals Entered By: Ok Anis CMA (June 16, 2009 11:05 AM)  Physical Exam  General:  mild distress, teary-eyed. Eyes:  PERRLA, no icterus. Mouth:  No deformity or lesions, dentition normal. Neck:  Supple; no masses or thyromegaly. Lungs:  Clear throughout to auscultation. Heart:  Regular rate and rhythm; no murmurs, rubs,  or bruits. Abdomen:  soft abdomen with normoactive bowel sounds and marked tenderness in epigastrium and along the right costal margin. There is guarding over the liver which appears to be normal in size. There is no rebound and no palpable mass. Left upper and lower quadrants are unremarkable. There is no CVA tenderness. Rectal:  normal rectal tone with hemoccult-negative stool. Extremities:  No clubbing, cyanosis, edema or deformities noted. Skin:  Intact without significant lesions or rashes.   Impression & Recommendations:  Problem # 1:  ABDOMINAL PAIN, RIGHT UPPER QUADRANT (ICD-789.01) Patient has acute right upper quadrant abdominal pain likely related to the use of anti-inflammatory agents causing gastritis. Her biliary studies have been negative. He will check her liver function tests today as well as her CBC. Patient will start Nexium 40 mg twice a day for 5 days then will decrease to 40 mg daily. I have set her up for an upper endoscopy to rule out a hernia or esophagitis which was noted on a  prior exam 25 years ago. I asked her not to take any anti-inflammatory agents. I have not ruled out the possibility of biliary dysfunction but it is less likely in the setting of a normal hepatobiliary scan. I will consider a CT scan of the abdomen if symptoms do not improve. Orders: TLB-CBC Platelet - w/Differential (85025-CBCD) TLB-Hepatic/Liver Function Pnl (80076-HEPATIC) EGD (EGD)  Problem # 2:  SPECIAL SCREENING FOR MALIGNANT NEOPLASMS COLON (ICD-V76.51) Patint is status post screening colonoscopy in May 2008. Findings showed small internal hemorrhoids. There were no polyps. A recall colonoscopy will be due in 10 years.  Patient Instructions: 1)  full liquids and light foods. 2)  Nexium 40 mg twice a day for 5 days then one a day. 3)  Scheduled endoscopy. 4)  Avoid anti-inflammatory agents. 5)  Liver function tests and CBC today. 6)  Copy sent to : Birdie Sons, MD Prescriptions: NEXIUM 40 MG CPDR (ESOMEPRAZOLE MAGNESIUM) Take 1 tablet by mouth once a day  #30 x 5   Entered by:   Hortense Ramal CMA (AAMA)   Authorized by:   Hart Carwin MD   Signed by:   Hortense Ramal CMA (AAMA) on 06/16/2009   Method used:   Electronically to        [facility]. [phone]* (retail)  [address]       [place], [place]  [address]       Ph: [phone]       Fax: [phone]   RxID:   [id number]

## 2010-11-14 NOTE — Progress Notes (Signed)
Summary: refill Micardis  Phone Note Call from Patient   Caller: Patient Call For: Dr. Cato Mulligan Summary of Call: Pt. is calling to ask for Micardis prescription to be called to [facility]) [phone] Initial call taken by: Lynann Beaver CMA,  April 30, 2008 10:12 AM  Follow-up for Phone Call        Rx sent electronically, pt informed Follow-up by: Sid Falcon LPN,  April 30, 2008 12:54 PM      Prescriptions: MICARDIS 80 MG  TABS (TELMISARTAN) Take 1 tablet by mouth once a day  #30 x 6   Entered by:   Sid Falcon LPN   Authorized by:   Birdie Sons MD   Signed by:   Sid Falcon LPN on 29/56/2130   Method used:   Electronically sent to ...       [facility]. #[id number]*       [phone]       [place], [place]  [address]       Ph: [phone]       Fax: [phone]   RxID:   [id number]

## 2010-11-14 NOTE — Progress Notes (Signed)
Summary: speak to RN about BP issues/appt  Phone Note Call from Patient Call back at Home Phone [phone]   Caller: Patient Reason for Call: Talk to Nurse Summary of Call: Patient has appt 3-31.  She states her BP is "not doing what he thought" so she wants to wait a couple of weeks to come in.  First available is 02-13-10.Marland KitchenMarland KitchenShe would like to know if that will be okay...speak to RN. Initial call taken by: Burnard Leigh,  January 11, 2010 9:12 AM  Follow-up for Phone Call        suppose to come in tomorrow to change BP med.  her BP has been great  120/70. 130/80 mthe highest.  ? wants to know if we want her to stay with what she has until we see a change in BP.  Will go ahead and cx apt for tomorrow and reschedule for one month out unless she sees a change then she will call me back  [name], RN, BSN  January 11, 2010 10:07 AM

## 2010-11-14 NOTE — Procedures (Signed)
Summary: Endoscopy   EGD  Procedure date:  06/23/2009  Findings:      Location: [facility]   OPERATIVE PROCEDURE REPORT  PATIENT: Sheri Martinez, [name] MR#: [id number] BIRTHDATE:  May 21, 1956  GENDER:  female  ENDOSCOPIST:  Hedwig Morton. Juanda Chance, MD ASSISTANT:   PROCEDURE DATE:  06/23/2009 PRE-PROCEDURE PREPERATION:  PRE-PROCEDURE PHYSICAL:  Lindley Magnus, MD PROCEDURE: EGD with biopsy ASA CLASS:  Class I INDICATIONS: 1) abdominal pain RUQ and Epigastric, pt was on NSAID's, now improved on Nexiem 40 mg po bid x 5 days  last EGD 1985  MEDICATIONS:  Versed 6 mg, Fentanyl 50 mcg TOPICAL ANESTHETIC:  Exactacain Spray  DESCRIPTION OF PROCEDURE:   After the risks benefits and alternatives of the procedure were thoroughly explained, informed consent was obtained.  The LB [id number] endoscope was introduced through the mouth and advanced to the second portion of the duodenum, without limitations.  The instrument was slowly withdrawn as the mucosa was fully examined. <<PROCEDUREIMAGES>>            <<OLD IMAGES>>  The upper, middle, and distal third of the esophagus were carefully inspected and no abnormalities were noted. The z-line was well seen at the GEJ. The endoscope was pushed into the fundus which was normal including a retroflexed view. The antrum,gastric body, first and second part of the duodenum were unremarkable. Bx gastric antrum, essentially normal appearing stomach, minimal erythema With standard forceps, a biopsy was obtained and sent to pathology (see image1, image2, image3, image4, image5, and image6).    Retroflexed views revealed no abnormalities.    The scope was then withdrawn from the patient and the procedure terminated.  COMPLICATIONS:  None  IMPRESSION: 1) Normal EGD  s/p gastric biopsies RECOMMENDATIONS: 1) await biopsy results  continue Nexiem 40 mg, decrease to 1 po qd, no NSAID's  REPEAT EXAM:  In 0 year(s) for. DISCHARGE INSTRUCTIONS:    _______________________________ Hedwig Morton. Juanda Chance, MD  CPT CODES:   DIAGNOSIS CODES:    CC:      REPORT OF SURGICAL PATHOLOGY   Case #: [id number] Patient Name: Sheri Martinez, Sheri Martinez. Office Chart Number:  N/A [id number] MRN: [id number] Pathologist: H. Hollice Espy, MD DOB/Age  [birth year] (Age: 21)    Gender: F Date Taken:  06/23/2009 Date Received: 06/24/2009   FINAL DIAGNOSIS   ***MICROSCOPIC EXAMINATION AND DIAGNOSIS***   STOMACH, BIOPSY:  MINIMAL CHRONIC GASTRITIS, NO EVIDENCE OF HELICOBACTER PYLORI, INTESTINAL METAPLASIA, DYSPLASIA, OR MALIGNANCY IDENTIFIED.   COMMENT A Warthin-Starry stain is performed to determine the possibility of the presence of Helicobacter pylori. The Warthin-Starry stain is negative for organisms of Helicobacter pylori. The control(s) stained appropriately.   gdt Date Reported:  06/27/2009     H. Hollice Espy, MD *** Electronically Signed Out By [facility] ***    June 27, 2009 MRN: [id number]    [facility] [address] [place], [place]  [address]    Dear Ms. Allison Quarry,  I am pleased to inform you that the biopsies taken during your recent endoscopic examination did not show any evidence of cancer upon pathologic examination.The tissue from the stomach showed mild inflammation  Additional information/recommendations:  __No further action is needed at this time.  Please follow-up with      your primary care physician for your other healthcare needs.  __ Please call [phone] to schedule a return visit to review      your condition.  __x Continue with the treatment plan as outlined on the day of your  exam.     Please call us if you are having persistent problems or have questions about your condition that have not been fully answered at this time.  Sincerely,  Hart Carwin MD  This letter has been electronically signed by your physician.   Signed by Hart Carwin MD on 06/27/2009 at 12:56 PM   This report was created  from the original endoscopy report, which was reviewed and signed by the above listed endoscopist.

## 2010-11-14 NOTE — Progress Notes (Signed)
Summary: BP CONCERNS  Phone Note Call from Patient   Caller: Patient Reason for Call: Talk to Nurse Summary of Call: PT IS CONCERNED ABOUT HER BLOOD PRESSURE FOR THE LAST 3 MORNINGS IT IS BELOW NORMAL 90/60 PLS CALL PT [phone] Initial call taken by: Sydell Axon,  February 07, 2009 10:09 AM  Follow-up for Phone Call        Return phone call to patient left voicemail for patient to call back.  [name], RN, BSN  February 07, 2009 10:27 AM BP 90/60 She feels very dizzy, weak, some nausea. She is going to cut her Micardis dose from 80mg  daily to 40mg  daily and we will check back with her 0n 02/08/09 [name], RN, BSN  February 07, 2009 10:40 AM  Additional Follow-up for Phone Call Additional follow up Details #1::        [employer] IS REQUESTING A 90 DAYS SUPPLY OF AMLODIPINE. [phone] REF # [phone]

## 2010-11-14 NOTE — Procedures (Signed)
Summary: Gastroenterology EGD  Gastroenterology EGD   Imported By: Francee Piccolo CMA 04/01/2008 15:48:00  _____________________________________________________________________  External Attachment:    Type:   Image     Comment:   External Document

## 2010-11-14 NOTE — Miscellaneous (Signed)
Summary: Prilosec Rx  Clinical Lists Changes  Medications: Rx of PRILOSEC 20 MG CPDR (OMEPRAZOLE) Take 1 tablet by mouth once a day;  #90 x 0;  Signed;  Entered by: Hortense Ramal CMA (AAMA);  Authorized by: Hart Carwin MD;  Method used: Electronically to [facility] , ,   , Ph: [phone], Fax: [phone]    Prescriptions: PRILOSEC 20 MG CPDR (OMEPRAZOLE) Take 1 tablet by mouth once a day  #90 x 0   Entered by:   Hortense Ramal CMA (AAMA)   Authorized by:   Hart Carwin MD   Signed by:   Hortense Ramal CMA (AAMA) on 12/05/2009   Method used:   Electronically to        [facility]* (mail-order)             ,          Ph: [phone]       Fax: [phone]   RxID:   [id number]

## 2010-11-14 NOTE — Progress Notes (Signed)
Summary: UTI sx, on Cipro  Phone Note Call from Patient   Caller: Patient Call For: Sheri Martinez Summary of Call: FYI for Dr Cato Mulligan.  Pt just got a call from the hospital and was informed they identified UTI sx with labs from 6/11 ER visit.  Pt was ordered Rx  for Cipro.  Pt just wanted Dr Cato Mulligan to be made aware. Initial call taken by: Sid Falcon LPN,  April 07, 2008 3:59 PM

## 2010-12-21 ENCOUNTER — Telehealth: Payer: Self-pay | Admitting: Internal Medicine

## 2010-12-21 NOTE — Telephone Encounter (Signed)
Pt called says that she may have a bladder inf. Pt is req to get a ua done, lab only, not ov. Pls advise.

## 2010-12-22 ENCOUNTER — Encounter: Payer: Self-pay | Admitting: Family Medicine

## 2010-12-22 ENCOUNTER — Ambulatory Visit (INDEPENDENT_AMBULATORY_CARE_PROVIDER_SITE_OTHER): Payer: BC Managed Care – PPO | Admitting: Family Medicine

## 2010-12-22 VITALS — BP 180/94 | Temp 98.6°F | Ht 63.5 in | Wt 170.0 lb

## 2010-12-22 DIAGNOSIS — R1031 Right lower quadrant pain: Secondary | ICD-10-CM

## 2010-12-22 LAB — CBC WITH DIFFERENTIAL/PLATELET
Basophils Absolute: 0 10*3/uL (ref 0.0–0.1)
Basophils Relative: 0 % (ref 0–1)
Eosinophils Absolute: 0.1 10*3/uL (ref 0.0–0.7)
Eosinophils Relative: 1 % (ref 0–5)
HCT: 38.2 % (ref 36.0–46.0)
Hemoglobin: 12.6 g/dL (ref 12.0–15.0)
Lymphocytes Relative: 30 % (ref 12–46)
Lymphs Abs: 1.5 10*3/uL (ref 0.7–4.0)
MCH: 29.2 pg (ref 26.0–34.0)
MCHC: 33 g/dL (ref 30.0–36.0)
MCV: 88.6 fL (ref 78.0–100.0)
Monocytes Absolute: 0.4 10*3/uL (ref 0.1–1.0)
Monocytes Relative: 8 % (ref 3–12)
Neutro Abs: 2.9 10*3/uL (ref 1.7–7.7)
Neutrophils Relative %: 60 % (ref 43–77)
Platelets: 188 10*3/uL (ref 150–400)
RBC: 4.31 MIL/uL (ref 3.87–5.11)
RDW: 12.8 % (ref 11.5–15.5)
WBC: 4.9 10*3/uL (ref 4.0–10.5)

## 2010-12-22 LAB — POCT URINALYSIS DIPSTICK
Glucose, UA: NEGATIVE
Ketones, UA: NEGATIVE
Leukocytes, UA: NEGATIVE
Nitrite, UA: NEGATIVE
Protein, UA: NEGATIVE
Spec Grav, UA: 1.03
pH, UA: 5

## 2010-12-22 NOTE — Patient Instructions (Signed)
ER evaluation for any fever, vomiting, or any progressive abdominal pain.

## 2010-12-22 NOTE — Progress Notes (Signed)
  Subjective:    Patient ID: Sheri Martinez, female    DOB: [birth year], 55 y.o.   MRN: [id number]  HPI  Acute visit. Onset 2 days ago right lower quadrant abdominal pain. Symptoms seemed to peak yesterday and actually somewhat better today. No fever. Has had decreased appetite and nausea but no vomiting. No dysuria. Some mild loose stools. Symptoms somewhat improved supine and worse with standing and sitting. Prior history total of abdominal hysterectomy. Patient denies any skin rashes.   Review of Systems  Constitutional: Positive for appetite change and fatigue. Negative for fever, chills and activity change.  Respiratory: Negative for cough.   Cardiovascular: Negative for chest pain, palpitations and leg swelling.  Gastrointestinal: Positive for nausea and abdominal pain. Negative for vomiting, diarrhea, constipation, blood in stool and abdominal distention.  Genitourinary: Negative for dysuria, hematuria and flank pain.  Musculoskeletal: Negative for back pain.  Skin: Negative for rash.  Hematological: Negative for adenopathy.       Objective:   Physical Exam  patient is alert and nontoxic in appearance. Afebrile. Neck supple with no adenopathy Chest clear to auscultation Heart regular rhythm and rate Abdomen nondistended. Normal bowel sounds. Soft with minimal tenderness right lower quadrant to deep palpation. No guarding or rebound tenderness.  Skin exam no rash       Assessment & Plan:   abdominal pain right lower quadrant. Symptoms are improved today compared with yesterday. CBC obtained. Urinalysis unremarkable. May need CT scan abdomen and pelvis to further evaluate if she has any fever , worsening pain or if white blood count significantly elevated

## 2010-12-22 NOTE — Telephone Encounter (Signed)
OV

## 2010-12-24 ENCOUNTER — Encounter: Payer: Self-pay | Admitting: Family Medicine

## 2010-12-28 ENCOUNTER — Ambulatory Visit: Payer: Self-pay | Admitting: Internal Medicine

## 2010-12-29 ENCOUNTER — Telehealth: Payer: Self-pay | Admitting: Family Medicine

## 2010-12-29 DIAGNOSIS — R1031 Right lower quadrant pain: Secondary | ICD-10-CM

## 2010-12-29 NOTE — Telephone Encounter (Signed)
Pt came in for on on 12/22/10 to see Dr Caryl Never.

## 2010-12-29 NOTE — Telephone Encounter (Signed)
Sx really bad on Friday last week, sx are not as bad, but still having right side pain, pressure on bladder, , no blood noted in urine or stools, no fever, no vomiting, some nausea.  Wondering if she needs [place] of that area?

## 2010-12-29 NOTE — Telephone Encounter (Signed)
Was seen last week. Still having side pain on her bladder. Wants to take the next step. Please return call.

## 2011-01-01 NOTE — Telephone Encounter (Signed)
Needs ct abdomen and pelvis with oral and iv contrast for RLQ pain-  Reviewed dr burchette's note

## 2011-01-02 ENCOUNTER — Telehealth: Payer: Self-pay | Admitting: *Deleted

## 2011-01-02 NOTE — Telephone Encounter (Signed)
Scheduled Abdomen of CT and Pelvis.

## 2011-01-02 NOTE — Telephone Encounter (Signed)
Please see referral for CT abdomen and Pelvis.

## 2011-01-05 ENCOUNTER — Ambulatory Visit (INDEPENDENT_AMBULATORY_CARE_PROVIDER_SITE_OTHER)
Admission: RE | Admit: 2011-01-05 | Discharge: 2011-01-05 | Disposition: A | Discharge status: Home / Self Care | Payer: BC Managed Care – PPO | Source: Ambulatory Visit | Attending: Internal Medicine | Admitting: Internal Medicine

## 2011-01-05 DIAGNOSIS — R1031 Right lower quadrant pain: Secondary | ICD-10-CM

## 2011-01-05 HISTORY — DX: Essential (primary) hypertension: I10

## 2011-01-05 MED ORDER — IOHEXOL 300 MG/ML  SOLN
80.0000 mL | Freq: Once | INTRAMUSCULAR | Status: AC | PRN
  Administered 2011-01-05: 80 mL via INTRAVENOUS

## 2011-01-08 ENCOUNTER — Telehealth: Payer: Self-pay | Admitting: *Deleted

## 2011-01-08 NOTE — Telephone Encounter (Signed)
If still having pain it may be best for her to see GI

## 2011-01-08 NOTE — Telephone Encounter (Signed)
Gave pt normal CT results and she wanted to know what the next was

## 2011-01-08 NOTE — Telephone Encounter (Signed)
Pt has an appt with Dr Juanda Chance.  Pt will talk to her then

## 2011-01-25 ENCOUNTER — Ambulatory Visit (INDEPENDENT_AMBULATORY_CARE_PROVIDER_SITE_OTHER): Payer: BC Managed Care – PPO | Admitting: Internal Medicine

## 2011-01-25 ENCOUNTER — Other Ambulatory Visit (INDEPENDENT_AMBULATORY_CARE_PROVIDER_SITE_OTHER): Payer: BC Managed Care – PPO

## 2011-01-25 ENCOUNTER — Encounter: Payer: Self-pay | Admitting: Internal Medicine

## 2011-01-25 VITALS — BP 152/96 | HR 80 | Ht 64.0 in | Wt 167.0 lb

## 2011-01-25 DIAGNOSIS — R1011 Right upper quadrant pain: Secondary | ICD-10-CM

## 2011-01-25 LAB — DIFFERENTIAL
Basophils Absolute: 0 10*3/uL (ref 0.0–0.1)
Basophils Absolute: 0 10*3/uL (ref 0.0–0.1)
Basophils Relative: 1 % (ref 0–1)
Basophils Relative: 1 % (ref 0–1)
Eosinophils Absolute: 0 10*3/uL (ref 0.0–0.7)
Eosinophils Absolute: 0 10*3/uL (ref 0.0–0.7)
Eosinophils Relative: 1 % (ref 0–5)
Eosinophils Relative: 1 % (ref 0–5)
Lymphocytes Relative: 23 % (ref 12–46)
Lymphocytes Relative: 31 % (ref 12–46)
Lymphs Abs: 0.9 10*3/uL (ref 0.7–4.0)
Lymphs Abs: 1.3 10*3/uL (ref 0.7–4.0)
Monocytes Absolute: 0.3 10*3/uL (ref 0.1–1.0)
Monocytes Absolute: 0.3 10*3/uL (ref 0.1–1.0)
Monocytes Relative: 7 % (ref 3–12)
Monocytes Relative: 8 % (ref 3–12)
Neutro Abs: 2.5 10*3/uL (ref 1.7–7.7)
Neutro Abs: 2.8 10*3/uL (ref 1.7–7.7)
Neutrophils Relative %: 60 % (ref 43–77)
Neutrophils Relative %: 69 % (ref 43–77)

## 2011-01-25 LAB — CARDIAC PANEL(CRET KIN+CKTOT+MB+TROPI)
CK, MB: 0.7 ng/mL (ref 0.3–4.0)
Relative Index: INVALID (ref 0.0–2.5)
Total CK: 38 U/L (ref 7–177)
Troponin I: 0.02 ng/mL (ref 0.00–0.06)

## 2011-01-25 LAB — POCT I-STAT, CHEM 8
BUN: 17 mg/dL (ref 6–23)
Calcium, Ion: 1.16 mmol/L (ref 1.12–1.32)
Chloride: 107 mEq/L (ref 96–112)
Creatinine, Ser: 0.9 mg/dL (ref 0.4–1.2)
Glucose, Bld: 93 mg/dL (ref 70–99)
HCT: 41 % (ref 36.0–46.0)
Hemoglobin: 13.9 g/dL (ref 12.0–15.0)
Potassium: 3.8 mEq/L (ref 3.5–5.1)
Sodium: 143 mEq/L (ref 135–145)
TCO2: 28 mmol/L (ref 0–100)

## 2011-01-25 LAB — URINALYSIS, ROUTINE W REFLEX MICROSCOPIC
Bilirubin Urine: NEGATIVE
Glucose, UA: NEGATIVE mg/dL
Hgb urine dipstick: NEGATIVE
Ketones, ur: NEGATIVE mg/dL
Nitrite: NEGATIVE
Protein, ur: NEGATIVE mg/dL
Specific Gravity, Urine: 1.004 — ABNORMAL LOW (ref 1.005–1.030)
Urobilinogen, UA: 0.2 mg/dL (ref 0.0–1.0)
pH: 7 (ref 5.0–8.0)

## 2011-01-25 LAB — LIPASE: Lipase: 19 U/L (ref 11.0–59.0)

## 2011-01-25 LAB — HEPATIC FUNCTION PANEL
ALT: 26 U/L (ref 0–35)
AST: 21 U/L (ref 0–37)
Albumin: 4.5 g/dL (ref 3.5–5.2)
Alkaline Phosphatase: 67 U/L (ref 39–117)
Bilirubin, Direct: 0.1 mg/dL (ref 0.0–0.3)
Total Bilirubin: 0.9 mg/dL (ref 0.3–1.2)
Total Protein: 7.3 g/dL (ref 6.0–8.3)

## 2011-01-25 LAB — LIPID PANEL
Cholesterol: 196 mg/dL (ref 0–200)
HDL: 36 mg/dL — ABNORMAL LOW (ref >39)
LDL Cholesterol: 141 mg/dL — ABNORMAL HIGH (ref 0–99)
Total CHOL/HDL Ratio: 5.4 RATIO
Triglycerides: 96 mg/dL (ref <150)
VLDL: 19 mg/dL (ref 0–40)

## 2011-01-25 LAB — CBC
HCT: 34.9 % — ABNORMAL LOW (ref 36.0–46.0)
HCT: 39.1 % (ref 36.0–46.0)
Hemoglobin: 11.9 g/dL — ABNORMAL LOW (ref 12.0–15.0)
Hemoglobin: 13.2 g/dL (ref 12.0–15.0)
MCHC: 33.7 g/dL (ref 30.0–36.0)
MCHC: 34 g/dL (ref 30.0–36.0)
MCV: 87.7 fL (ref 78.0–100.0)
MCV: 87.7 fL (ref 78.0–100.0)
Platelets: 153 10*3/uL (ref 150–400)
Platelets: 159 10*3/uL (ref 150–400)
RBC: 3.98 MIL/uL (ref 3.87–5.11)
RBC: 4.46 MIL/uL (ref 3.87–5.11)
RDW: 13.8 % (ref 11.5–15.5)
RDW: 14 % (ref 11.5–15.5)
WBC: 4.1 10*3/uL (ref 4.0–10.5)
WBC: 4.1 10*3/uL (ref 4.0–10.5)

## 2011-01-25 LAB — BASIC METABOLIC PANEL
BUN: 19 mg/dL (ref 6–23)
CO2: 28 mEq/L (ref 19–32)
Calcium: 9 mg/dL (ref 8.4–10.5)
Chloride: 108 mEq/L (ref 96–112)
Creatinine, Ser: 0.77 mg/dL (ref 0.4–1.2)
GFR calc Af Amer: >60 mL/min (ref >60)
GFR calc non Af Amer: >60 mL/min (ref >60)
Glucose, Bld: 94 mg/dL (ref 70–99)
Potassium: 3.7 mEq/L (ref 3.5–5.1)
Sodium: 141 mEq/L (ref 135–145)

## 2011-01-25 LAB — TSH: TSH: 1.835 u[IU]/mL (ref 0.350–4.500)

## 2011-01-25 LAB — AMYLASE: Amylase: 22 U/L — ABNORMAL LOW (ref 27–131)

## 2011-01-25 LAB — POCT CARDIAC MARKERS
CKMB, poc: <1 ng/mL — ABNORMAL LOW (ref 1.0–8.0)
Myoglobin, poc: 82.2 ng/mL (ref 12–200)
Troponin i, poc: <0.05 ng/mL (ref 0.00–0.09)

## 2011-01-25 LAB — D-DIMER, QUANTITATIVE (NOT AT ARMC): D-Dimer, Quant: <0.22 ug/mL-FEU (ref 0.00–0.48)

## 2011-01-25 MED ORDER — ESOMEPRAZOLE MAGNESIUM 40 MG PO CPDR: 40.0000 mg | DELAYED_RELEASE_CAPSULE | Freq: Every day | ORAL | Status: DC

## 2011-01-25 MED ORDER — HYDROCODONE-ACETAMINOPHEN 5-325 MG PO TABS: 1.0000 | ORAL_TABLET | Freq: Four times a day (QID) | ORAL | Status: AC | PRN

## 2011-01-25 NOTE — Patient Instructions (Addendum)
You have been scheduled for an endoscopy on 01/26/11. Please follow written instructions given to you at your visit today. You have been scheduled for a HIDA scan at [facility] on 02/06/11 @ 1pm . Please arrive at 12:45 pm for registration. Make certain to have no food or drink 6 hours prior to your test.  We have given you samples of Nexium to take 1 capsule by mouth once daily We have given you samples of Miralax. Please dissolve 1/2 pack dissolved in at least 8 ounces water/juice over the next several days until gone. We have given you a prescription for Vicodin to take to your pharmacy. Your physician has requested that you go to the basement for the following lab work before leaving today: LFT's, Amylase, Lipase

## 2011-01-25 NOTE — Progress Notes (Signed)
Sheri Martinez [birth year] MRN [id number]   History of Present Illness:  This is a 55 year old white female with recurrent right upper quadrant and right costal margin abdominal pain of 4 weeks' duration. The pain started in the epigastrium and has persisted intermittently over the last 4 weeks. There was no fever. Some of the pain radiated to the back. Over the last 2 weeks, she has experienced constipation. She had a similar episode in January 2011 and prior to that in 2008. She has a strong family history of gallbladder disease in 2 maternal aunts. An ultrasound of the gallbladder was negative x 3 and a HIDA scan in August 2010 was normal showing 43% ejection fraction. Her liver function test in January 2011 were negative. A CT scan of the abdomen in September 2010 raised a question of duodenitis. An upper endoscopy at that time was normal not showing any duodenitis. Her most recent CT scan of the abdomen in March of this year showed no acute findings. Her last colonoscopy in 2008 showed internal hemorrhoids but no diverticuli. A CT angiogram in June 2009 was negative. She denies taking NSAIDs.   Past Medical History  Diagnosis Date  . Hypertension   . GERD (gastroesophageal reflux disease)   . Hiatal hernia   . Internal hemorrhoids   . Chronic gastritis    Past Surgical History  Procedure Date  . Abdominal hysterectomy     due to endometriosis  . Nasal septum surgery     reports that she quit smoking about 32 years ago. Her smoking use included Cigarettes. She has a 1.5 pack-year smoking history. She does not have any smokeless tobacco history on file. She reports that she drinks alcohol. She reports that she does not use illicit drugs. family history includes Heart disease in an unspecified family member; Hypertension in her mother; and Ovarian cancer in an unspecified family member.  There is no history of Colon cancer. Allergies  Allergen Reactions  . Penicillins     dizzy  .  Sulfamethoxazole     REACTION: rash        Review of Systems: Denies dysphagia, odynophagia, fever, no rectal bleeding.  The remainder of the 10  point ROS is negative except as outlined in H&P   Physical Exam: General appearance  Well developed, in no distress. Eyes- non icteric. HEENT nontraumatic, normocephalic. Mouth no lesions, tongue papillated, no cheilosis. Neck supple without adenopathy, thyroid not enlarged, no carotid bruits, no JVD. Lungs Clear to auscultation bilaterally. Cor normal S1 normal S2, regular rhythm , no murmur,  quiet precordium. Abdomen soft abdomen with decreased bowel sounds. No distention. Marked tenderness in epigastrium and right costal margin. No rebound. Lower abdomen unremarkable. Rectal: Showed Hemoccult negative stool. Extremities no pedal edema. Skin no lesions. Neurological alert and oriented x 3. Psychological normal mood and affect.  Assessment and Plan:  Problems #1 recurrent right upper quadrant abdominal pain and epigastric pain. She is status post extensive gastrointestinal evaluation. Despite negative biliary studies, I still feel gallbladder dysfunction is a possibility.  We will proceed with a HIDA scan. We will also start her on Nexium 40 mg daily and schedule her for an upper endoscopy to r/o gastritis. She has a strong family history of gallbladder disease. At some point, we may consider a cholecystectomy.  Problem #2 constipation. This may be an a dynamic ileus following an acute episode of abdominal pain. I advised her to stay on light food and liquids and take a mild laxative  such as MiraLax. She is up-to-date on her colonoscopy.   01/25/2011 Lina Sar

## 2011-01-26 ENCOUNTER — Encounter: Payer: Self-pay | Admitting: Internal Medicine

## 2011-01-26 ENCOUNTER — Telehealth: Payer: Self-pay | Admitting: *Deleted

## 2011-01-26 ENCOUNTER — Ambulatory Visit (AMBULATORY_SURGERY_CENTER): Payer: BC Managed Care – PPO | Admitting: Internal Medicine

## 2011-01-26 VITALS — BP 153/78 | HR 67 | Temp 97.3°F | Resp 14 | Ht 64.0 in | Wt 167.0 lb

## 2011-01-26 DIAGNOSIS — R1013 Epigastric pain: Secondary | ICD-10-CM

## 2011-01-26 DIAGNOSIS — K449 Diaphragmatic hernia without obstruction or gangrene: Secondary | ICD-10-CM

## 2011-01-26 DIAGNOSIS — R109 Unspecified abdominal pain: Secondary | ICD-10-CM

## 2011-01-26 DIAGNOSIS — R1084 Generalized abdominal pain: Secondary | ICD-10-CM

## 2011-01-26 MED ORDER — SODIUM CHLORIDE 0.9 % IV SOLN: 500.0000 mL | INTRAVENOUS | Status: DC

## 2011-01-26 NOTE — Patient Instructions (Signed)
Please read your handout about hiatal hernias.   Good luck with your HIDA scan.  Please continue to take your nexium 40mg  per day per Dr. Juanda Chance, and your mirilax 17 gms as needed for constipation.   If you have any questions, please call us at [phone]. Thank-you.

## 2011-01-26 NOTE — Telephone Encounter (Signed)
Message copied by Jesse Fall on Fri Jan 26, 2011  8:30 AM ------      Message from: [place], [place]      Created: Thu Jan 25, 2011  9:40 PM       Please call pt with normal blood test results.

## 2011-01-26 NOTE — Telephone Encounter (Signed)
Left a message for patient on her home number with her lab results.

## 2011-01-29 ENCOUNTER — Telehealth: Payer: Self-pay | Admitting: *Deleted

## 2011-01-29 NOTE — Telephone Encounter (Signed)

## 2011-02-01 ENCOUNTER — Encounter: Payer: Self-pay | Admitting: Internal Medicine

## 2011-02-02 ENCOUNTER — Ambulatory Visit: Payer: Self-pay | Admitting: Internal Medicine

## 2011-02-06 ENCOUNTER — Ambulatory Visit (HOSPITAL_COMMUNITY)
Admission: RE | Admit: 2011-02-06 | Discharge: 2011-02-06 | Disposition: A | Discharge status: Home / Self Care | Payer: BC Managed Care – PPO | Source: Ambulatory Visit | Attending: Internal Medicine | Admitting: Internal Medicine

## 2011-02-06 DIAGNOSIS — R932 Abnormal findings on diagnostic imaging of liver and biliary tract: Secondary | ICD-10-CM | POA: Insufficient documentation

## 2011-02-06 DIAGNOSIS — R1011 Right upper quadrant pain: Secondary | ICD-10-CM | POA: Insufficient documentation

## 2011-02-06 MED ORDER — TECHNETIUM TC 99M MEBROFENIN IV KIT: 5.5000 | PACK | Freq: Once | INTRAVENOUS | Status: AC | PRN

## 2011-02-07 ENCOUNTER — Telehealth: Payer: Self-pay | Admitting: *Deleted

## 2011-02-07 ENCOUNTER — Other Ambulatory Visit: Payer: Self-pay | Admitting: Internal Medicine

## 2011-02-07 DIAGNOSIS — R1011 Right upper quadrant pain: Secondary | ICD-10-CM

## 2011-02-07 NOTE — Telephone Encounter (Signed)
Spoke with Sheri Martinez at [facility] Surgery Scheduled patient with Dr. Abbey Chatters  On 02/09/11 1:00 PM arrival for 1:30 PM. Patient given the appointment information. Records faxed to [facility]

## 2011-02-07 NOTE — Telephone Encounter (Signed)
Message copied by Jesse Fall on Wed Feb 07, 2011  8:56 AM ------      Message from: Lina Sar      Created: Wed Feb 07, 2011  8:01 AM       I have spoken to the pt about abnormal HIDA scan. Please  Arrange  For her to be seen by a surgeon for consideration of cholecystectomy.

## 2011-02-07 NOTE — Telephone Encounter (Signed)
Message copied by Jesse Fall on Wed Feb 07, 2011  9:26 AM ------      Message from: Lina Sar      Created: Wed Feb 07, 2011  8:01 AM       I have spoken to the pt about abnormal HIDA scan. Please  Arrange  For her to be seen by a surgeon for consideration of cholecystectomy.

## 2011-02-07 NOTE — Telephone Encounter (Signed)
Spoke with patient and she does not have a Careers adviser or preference of one. Called and left a message for [place] to call me.

## 2011-02-10 ENCOUNTER — Other Ambulatory Visit: Payer: Self-pay | Admitting: Internal Medicine

## 2011-02-10 DIAGNOSIS — I10 Essential (primary) hypertension: Secondary | ICD-10-CM

## 2011-02-12 ENCOUNTER — Other Ambulatory Visit: Payer: Self-pay | Admitting: Internal Medicine

## 2011-02-13 HISTORY — PX: CHOLECYSTECTOMY: SHX55

## 2011-02-14 ENCOUNTER — Other Ambulatory Visit: Payer: Self-pay | Admitting: General Surgery

## 2011-02-14 ENCOUNTER — Ambulatory Visit (HOSPITAL_COMMUNITY)
Admission: RE | Admit: 2011-02-14 | Discharge: 2011-02-14 | Disposition: A | Discharge status: Home / Self Care | Payer: BC Managed Care – PPO | Source: Ambulatory Visit | Attending: General Surgery | Admitting: General Surgery

## 2011-02-14 ENCOUNTER — Encounter (HOSPITAL_COMMUNITY): Payer: BC Managed Care – PPO

## 2011-02-14 ENCOUNTER — Other Ambulatory Visit (HOSPITAL_COMMUNITY): Payer: Self-pay | Admitting: General Surgery

## 2011-02-14 DIAGNOSIS — K828 Other specified diseases of gallbladder: Secondary | ICD-10-CM

## 2011-02-14 DIAGNOSIS — Z01812 Encounter for preprocedural laboratory examination: Secondary | ICD-10-CM | POA: Insufficient documentation

## 2011-02-14 DIAGNOSIS — Z01818 Encounter for other preprocedural examination: Secondary | ICD-10-CM | POA: Insufficient documentation

## 2011-02-14 DIAGNOSIS — Z01810 Encounter for preprocedural cardiovascular examination: Secondary | ICD-10-CM | POA: Insufficient documentation

## 2011-02-14 LAB — CBC
HCT: 37.6 % (ref 36.0–46.0)
Hemoglobin: 12.4 g/dL (ref 12.0–15.0)
MCH: 28.8 pg (ref 26.0–34.0)
MCHC: 33 g/dL (ref 30.0–36.0)
MCV: 87.4 fL (ref 78.0–100.0)
Platelets: 173 10*3/uL (ref 150–400)
RBC: 4.3 MIL/uL (ref 3.87–5.11)
RDW: 12.9 % (ref 11.5–15.5)
WBC: 4 10*3/uL (ref 4.0–10.5)

## 2011-02-14 LAB — PROTIME-INR
INR: 0.97 (ref 0.00–1.49)
Prothrombin Time: 13.1 seconds (ref 11.6–15.2)

## 2011-02-14 LAB — COMPREHENSIVE METABOLIC PANEL
ALT: 15 U/L (ref 0–35)
AST: 14 U/L (ref 0–37)
Albumin: 4.4 g/dL (ref 3.5–5.2)
Alkaline Phosphatase: 72 U/L (ref 39–117)
BUN: 18 mg/dL (ref 6–23)
CO2: 26 mEq/L (ref 19–32)
Calcium: 9.5 mg/dL (ref 8.4–10.5)
Chloride: 106 mEq/L (ref 96–112)
Creatinine, Ser: 0.81 mg/dL (ref 0.4–1.2)
GFR calc Af Amer: >60 mL/min (ref >60)
GFR calc non Af Amer: >60 mL/min (ref >60)
Glucose, Bld: 103 mg/dL — ABNORMAL HIGH (ref 70–99)
Potassium: 4.4 mEq/L (ref 3.5–5.1)
Sodium: 141 mEq/L (ref 135–145)
Total Bilirubin: 0.4 mg/dL (ref 0.3–1.2)
Total Protein: 7.7 g/dL (ref 6.0–8.3)

## 2011-02-14 LAB — DIFFERENTIAL
Basophils Absolute: 0 10*3/uL (ref 0.0–0.1)
Basophils Relative: 0 % (ref 0–1)
Eosinophils Absolute: 0.1 10*3/uL (ref 0.0–0.7)
Eosinophils Relative: 2 % (ref 0–5)
Lymphocytes Relative: 26 % (ref 12–46)
Lymphs Abs: 1 10*3/uL (ref 0.7–4.0)
Monocytes Absolute: 0.3 10*3/uL (ref 0.1–1.0)
Monocytes Relative: 7 % (ref 3–12)
Neutro Abs: 2.6 10*3/uL (ref 1.7–7.7)
Neutrophils Relative %: 65 % (ref 43–77)

## 2011-02-14 LAB — SURGICAL PCR SCREEN
MRSA, PCR: NEGATIVE
Staphylococcus aureus: NEGATIVE

## 2011-02-22 ENCOUNTER — Ambulatory Visit (HOSPITAL_COMMUNITY): Payer: BC Managed Care – PPO

## 2011-02-22 ENCOUNTER — Other Ambulatory Visit: Payer: Self-pay | Admitting: General Surgery

## 2011-02-22 ENCOUNTER — Ambulatory Visit (HOSPITAL_COMMUNITY)
Admission: RE | Admit: 2011-02-22 | Discharge: 2011-02-22 | Disposition: A | Discharge status: Home / Self Care | Payer: BC Managed Care – PPO | Source: Ambulatory Visit | Attending: General Surgery | Admitting: General Surgery

## 2011-02-22 DIAGNOSIS — I10 Essential (primary) hypertension: Secondary | ICD-10-CM | POA: Insufficient documentation

## 2011-02-22 DIAGNOSIS — Z79899 Other long term (current) drug therapy: Secondary | ICD-10-CM | POA: Insufficient documentation

## 2011-02-22 DIAGNOSIS — K828 Other specified diseases of gallbladder: Secondary | ICD-10-CM | POA: Insufficient documentation

## 2011-02-22 DIAGNOSIS — K811 Chronic cholecystitis: Secondary | ICD-10-CM | POA: Insufficient documentation

## 2011-02-22 NOTE — Op Note (Signed)
Sheri Martinez, Sheri Martinez                 ACCOUNT NO.:  [id number]  MEDICAL RECORD NO.:  [id number]           PATIENT TYPE:  O  LOCATION:  DAYL                         FACILITY:  [facility]  PHYSICIAN:  Adolph Pollack, M.D.DATE OF BIRTH:  25-Apr-1956  DATE OF PROCEDURE:  02/22/2011 DATE OF DISCHARGE:                              OPERATIVE REPORT   PREOPERATIVE DIAGNOSIS:  Biliary dyskinesia.  POSTOPERATIVE DIAGNOSIS:  Biliary dyskinesia.  PROCEDURE:  Laparoscopic cholecystectomy with cholangiogram.  SURGEON:  Adolph Pollack, M.D.  ASSISTANT:  Abigail Miyamoto, M.D.  ANESTHESIA:  General endotracheal.  INDICATIONS:  Sheri Martinez is a 55 year old female who about once a year has episodes of right upper quadrant discomfort radiating to the back and associated with nausea.  She has had significant evaluation.  She was noted have severely depressed gallbladder ejection fraction measuring only 5% consistent with biliary dyskinesia.  She has elected to undergo laparoscopic cholecystectomy for this.  We discussed the procedure, risks and aftercare preoperatively.  TECHNIQUE:  She was seen in the holding area and brought to the operating room, placed supine on the operating room table and given a general anesthetic.  Abdominal wall was sterilely prepped and draped. Local anesthetic was infiltrated in the subumbilical region (Marcaine). A small subumbilical incision was made through the skin, subcutaneous tissue, fascia and peritoneum entering the peritoneal cavity under direct vision.  A pursestring suture of 0 Vicryl was placed on fascial edges.  A Hassan trocar was introduced into the peritoneal cavity and pneumoperitoneum created by insufflation of CO2 gas.  Laparoscope was introduced.  There was no underlying organ injury or bleeding.  She was placed in reverse Trendelenburg position with right side tilted slightly up.  A 5-mm trocar was placed in epigastric incision and two 5 mm  trocars placed through 2 right upper quadrant incisions.  The fundus of the gallbladder was grasped and no acute inflammatory changes were noted.  The gallbladder fundus was retracted towards the right shoulder.  Some omental adhesions to the liver were taken down allowing for better liver retraction.  The infundibulum was grasped and using blunt dissection on the infundibulum I mobilized it.  I identified the cystic duct as well as cystic artery.  Windows were created around both of these and the critical view was achieved.  The cystic artery was clipped.  The cystic duct was clipped at the gallbladder and cystic duct junction and small incision made into the cystic duct.  A cholangiocath was passed through the anterior abdominal wall placed into the cystic duct and a cholangiogram performed.  Under real-time fluoroscopy, dilute contrast was injected into the cystic duct with a small amount of extravasation around the insertion site.  The common hepatic, right and left hepatic, and common bile ducts all filled promptly.  Contrast drained into the duodenum without obvious evidence of obstruction.  Final report pending radiologist interpretation.  The cholangiocath was removed, the cystic duct was then clipped 3 times on the biliary side and divided.  The cystic artery which had been previously clipped was then divided.  The gallbladder was dissected using electrocautery free from  the liver.  A puncture wound was made in the gallbladder with some leakage of bile.  Once the gallbladder was removed, it was placed in an Endopouch bag and removed through the subumbilical port/incision.  Subumbilical trocar was then replaced.  I then copiously irrigated out the perihepatic cavity and evacuated fluid.  The gallbladder fossa was inspected and bleeding points controlled with electrocautery.  Further inspection demonstrated adequate hemostasis and no evidence of bile leak.  Following this, I  removed the subumbilical trocar and closed the fascial defect under laparoscopic vision by tightening up and tightening down the pursestring suture.  The CO2 gas was released and the trocars removed.  Skin incisions were closed with 4-0 Monocryl subcuticular stitches.  Steri-Strips and sterile dressings were applied.  She tolerated the procedure without any apparent complications and was taken to the recovery room in satisfactory condition.     Adolph Pollack, M.D.     Kari Baars  D:  02/22/2011  T:  02/22/2011  Job:  [id number]  cc:   Hedwig Morton. Juanda Chance, MD [address] [place] [address]  Valetta Mole. Swords, MD [address] [place] [address]  Adolph Pollack, M.D. [address] [place] [address]  Electronically Signed by Avel Peace M.D. on 02/22/2011 08:48:35 PM

## 2011-02-23 ENCOUNTER — Telehealth: Payer: Self-pay | Admitting: *Deleted

## 2011-02-23 NOTE — Telephone Encounter (Signed)
Pt had GB removal (lap) yesterday.  She has developed a non-productive cough and nasal drainage.  Her surgeon instructed her to call her Primary MD to treat her as the cough is causing a lot of pain in her incision . No fever, but lots of congestion in head and chest.

## 2011-02-23 NOTE — Telephone Encounter (Signed)
Notified pt. 

## 2011-02-23 NOTE — Telephone Encounter (Signed)
mucinex dm bid for 7 days 

## 2011-02-27 NOTE — Assessment & Plan Note (Signed)
[facility]                         ELECTROPHYSIOLOGY OFFICE NOTE   NAME:COBBMiata, Sheri Martinez                        MRN:          [id number]  DATE:12/30/2008                            DOB:          04-May-1956    HISTORY OF PRESENT ILLNESS:  Sheri Martinez returns today for followup.  She  is a very pleasant middle-aged woman with a history of hypertension,  dyslipidemia, who was admitted to hospital for observation several weeks  ago with chest pain.  She ruled out for MI and has stress test, that was  carried out demonstrating no occult ischemia.  The patient returns today  for followup.  Her main complaint is that of difficulty with control her  blood pressure.  She denies chest pain or shortness of breath.   CURRENT MEDICATIONS:  Include  1. Labetalol 200 twice a day.  2. Micardis 80 mg daily.  3. She also takes aspirin 325 a day.   PHYSICAL EXAMINATION:  GENERAL:  She is a pleasant well appearing,  middle-aged woman in no acute distress.  VITAL SIGNS:  Her blood pressure today was 140/95, the pulse was 60 and  regular, and respirations were 18.  NECK:  Revealed no jugular venous distention.  LUNGS:  Clear bilaterally to auscultation.  No wheezes, rales, or  rhonchi are present.  No increased work of breathing.  CARDIOVASCULAR:  Regular rate and rhythm.  Normal S1 and S2.  ABDOMEN:  Soft and nontender.  There is no organomegaly.  EXTREMITIES:  Demonstrate no edema.   IMPRESSION:  1. Chest pain, now resolved with negative stress test.  2. Hypertension, still poorly controlled today.  I have asked her to      continue her labetalol and Micardis and we have added amlodipine 5      mg daily.  3. Palpitations.  The patient's palpitations have been quiescent.  If      they increase in frequency and severity, we are going to have her      wear a cardiac monitor.  I will plan to see the patient back in the      office in several months.     Sheri Martinez.  Sheri Ridgel, MD  Electronically Signed    GWT/MedQ  DD: 12/30/2008  DT: 12/31/2008  Job #: [id number]

## 2011-02-27 NOTE — H&P (Signed)
Sheri Martinez, Sheri Martinez                 ACCOUNT NO.:  [id number]   MEDICAL RECORD NO.:  [id number]          PATIENT TYPE:  EMS   LOCATION:  ED                           FACILITY:  [facility]   PHYSICIAN:  Doylene Canning. Ladona Ridgel, MD    DATE OF BIRTH:  1956/02/05   DATE OF ADMISSION:  12/16/2008  DATE OF DISCHARGE:                              HISTORY & PHYSICAL   ADMISSION DIAGNOSIS:  Chest pain.   CHIEF COMPLAINT:  Chest pain and palpitations.   HISTORY OF PRESENT ILLNESS:  Patient is 55 years old.  She has a  longstanding history of hypertension, who presents to the hospital today  with complaints of several days of shoulder and collar bone pain (no jaw  pain), who was in her usual state of health this morning when she felt  clammy, short of breath, and nauseated.  She subsequently presents for  additional evaluations.  Her symptoms have resolved.  Patient notes that  occasionally she will feel like her heart is beating hard at nighttime,  particularly when she lays down to sleep, and occasionally her heartbeat  is irregular.  She also notes occasional palpitations.  There have been  no sustained heart-racing spells, no syncope, no other complaints today.  She was admitted for additional evaluation.  She has no frank substernal  chest pain.   PAST MEDICAL HISTORY:  Notable for epistaxis with multiple nosebleeds in  the past, sometimes associated with hypertension.  She has a history of  seasonal allergies and is seen by Dr. Jethro Bolus.  She has a history  of hypertension, and she has been followed loosely by Dr. Cato Mulligan with  her blood pressure not particularly well controlled, particularly now  when she is in the doctor's office.  She has a history of hysterectomy  nearly 30 years ago.   MEDICATIONS:  1. Labetalol 200 twice daily.  2. Micardis 80 a day.   FAMILY HISTORY:  Notable for a brother who is still living with  hypertension and dyslipidemia.  Her father died in his 31's of  complications of heart disease.  She has a grandfather who died very  young of an MI.   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.  She has a remote tobacco use history for many  years.  She rarely uses alcohol.   REVIEW OF SYSTEMS:  Negative except as noted in the HPI with the  exception of generalized fatigue and weakness for the last several  weeks, otherwise all systems were reviewed and were negative, except as  noted above.   PHYSICAL EXAMINATION:  She is a pleasant, well-appearing middle-aged  woman in no acute distress.  Blood pressure was 170/90, pulse 70 and regular, respirations 18.  HEENT:  Head is normocephalic and atraumatic.  Pupils are equal and  round.  Oropharynx is moist.  Sclerae are anicteric.  NECK:  No jugular venous distention.  There is no thyromegaly.  Trachea  is midline.  The carotids are 2+ and symmetric.  LUNGS:  Clear bilaterally to auscultation.  No rales,  wheezes, or  rhonchi are present.  There is no increased work of breathing.  CARDIOVASCULAR:  Regular rate and rhythm with a normal S1 and S2.  There  is a soft S4 gallop present.  The PMI was not enlarged nor was it  laterally displaced.  ABDOMEN:  Soft and nontender.  There is no organomegaly.  EXTREMITIES:  No clubbing, cyanosis or edema.  Pulses are 2+ and  symmetric.  NEUROLOGIC:  Alert and oriented x3.  His cranial nerves are intact.  Strength is 5/5 and symmetric.   The EKG demonstrates sinus rhythm with normal axis and intervals.  There  were no acute ST-T wave changes.   IMPRESSION:  1. Very atypical chest pain associated with shortness of breath.  2. Palpitations.  3. Poorly controlled hypertension.   DISCUSSION:  I have discussed treatment options with the patient.  I  have recommended that we admit the patient to the hospital for serial  EKGs and cardiac enzymes.  We will watch her on telemetry to see if her  palpitations are  related to ventricular or atrial ectopy or A fib.  If  her enzymes are negative and her blood pressure is controlled, then we  would recommend an early discharge with an outpatient stress Myoview  study.  If her enzymes are positive, then transfer her to [facility] for a  left heart catheterization would be recommended.  We will have her  follow up with her blood pressure with Dr. Cato Mulligan.  We will check  fasting lipids.      Doylene Canning. Ladona Ridgel, MD  Electronically Signed     GWT/MEDQ  D:  12/16/2008  T:  12/16/2008  Job:  [id number]   cc:   Kerry Kass, M.D. [facility]  [address]  [place]  [place] [address]   Valetta Mole. Swords, MD  [address] [place]  [place] [address]

## 2011-02-27 NOTE — Assessment & Plan Note (Signed)
[facility]                         GASTROENTEROLOGY OFFICE NOTE   NAME:COBBChasitty, Hehl                        MRN:          [id number]  DATE:03/06/2007                            DOB:          1955/10/25    Sheri Martinez is a very nice 55 year old white female who comes for  evaluation of right upper quadrant abdominal pain which started about 1  or 2 months ago and has been intermittent.  The pain is located  anteriorly under the right costal margin and radiates laterally.  It has  occurred at night, as well as during the day, and is not necessarily  related to meals.  There has been no dyspepsia, heartburn or change in  the bowel habits, although most recently she feels she has had mild  constipation.  We have seen Sheri Martinez in the past.  Dr. Corinda Gubler did an  upper endoscopy on her in 1985 for gastroesophageal reflux.  She has a  positive family history of gallbladder disease in 2 of her maternal  aunts, but several upper abdominal ultrasounds in the past have been  negative, the most recent one in 2006.  There is no family history of  colon cancer and no family history of peptic ulcer disease or  inflammatory bowel disease.   MEDICATIONS:  1. Hormone replacement.  2. Allegra.   PHYSICAL EXAMINATION:  VITAL SIGNS:  Blood pressure 160/98, pulse 88 and  weight 171 pounds.  GENERAL:  She was alert and oriented, in no distress.  LUNGS:  Clear to auscultation.  COR:  Normal S1 and S2.  CHEST:  Exam of the rib cage did not show any abnormality or tenderness  of the ribs.  There was no evidence of herpes zoster, and there was rub  on inspiration.  ABDOMEN:  In the supine position, abdominal exam showed mild tenderness  in the right upper quadrant.  Liver edge by percussion was normal.  Overall span was about 8-9 cm.  There was mild tenderness along the  right costal margin, but liver edge was at the costal margin.  Left  upper quadrant was normal.  Lower  abdomen was normal.  There were no  scars.  Bowel sounds were normoactive.  RECTAL:  Normal rectal tones.  Stool was Hemoccult negative.   IMPRESSION:  A 55 year old white female with recent onset right upper  quadrant abdominal pain and some tenderness which suggested this either  could be biliary or  related to her right colon, e.i . IBS,  diverticulosis or inflammation.  She does not have symptoms of bleeding  .  The strong family history of gallbladder disease puts her at high  risk of biliary dysfunction. Despite having a normal ultrasound, she may  have a poorly functioning gallbladder.  For that reason, we may have to  pursue it further.   PLAN:  1. Robinul Forte 2 mg as an antispasmodic to use p.r.n. right upper      quadrant abdominal pain.  2. Colonoscopy will be scheduled with routine colonoscopy prep.  3. Upper abdominal ultrasound is negative.  Proceed with HIDA  scan      with [facility].  4. We will also check her liver function test today.  In addition,      fatty liver could cause right upper quadrant abdominal pain by      virtue of mild hepatomegaly.  The ultrasound, in that way, will be      helpful.   The patient agrees with the plan.     Sheri Martinez. Juanda Chance, MD  Electronically Signed    DMB/MedQ  DD: 03/06/2007  DT: 03/06/2007  Job #: [id number]   cc:   S. Kyra Manges, M.D.

## 2011-02-27 NOTE — Discharge Summary (Signed)
Sheri Martinez, Sheri Martinez                 ACCOUNT NO.:  [id number]   MEDICAL RECORD NO.:  [id number]          PATIENT TYPE:  OBV   LOCATION:  1407                         FACILITY:  [facility]   PHYSICIAN:  Madolyn Frieze. Jens Som, MD, FACCDATE OF BIRTH:  [birth year]   DATE OF ADMISSION:  12/16/2008  DATE OF DISCHARGE:  12/17/2008                               DISCHARGE SUMMARY   PRIMARY CARDIOLOGIST:  Dr. Lewayne Bunting.   PRIMARY CARE PHYSICIAN:  Dr. Birdie Sons.   PROCEDURES PERFORMED DURING HOSPITALIZATION:  None.   FINAL DISCHARGE DIAGNOSES:  1. Atypical chest pain with multiple cardiovascular risk factors.  2. Poorly-controlled hypertension.  3. Palpitations.  4. Elevated cholesterol.   HOSPITAL COURSE:  This is a 55 year old female with longstanding history  of hypertension who presented to the hospital with complaints of  shoulder and collarbone pain, no jaw pain, along with feelings of  clamminess, shortness of breath and nausea.  The patient's symptoms  resolved on initial evaluation.  The patient also admitted to  palpitations usually occurring at nighttime, when she lays down to  sleep.  The patient was seen and examined by Dr. Lewayne Bunting at [facility] and admitted to rule out cardiac etiology for  discomfort.  Cardiac enzymes were cycled and blood pressure was  monitored throughout hospitalization.  Initial blood pressure on  admission was 170/90.  The patient was restarted on home medications:  Labetalol 200 mg twice a day, Micardis 80 mg daily and aspirin 325  daily.  Blood pressure returned to normal with blood pressure 113/73.  The patient's cardiac enzymes were found to be negative x3.  Her  potassium was 3.7, D-dimer was also found to be negative.   On day of discharge the patient was seen and examined by Dr. Olga Millers and found to be stable.  The patient has ruled out for  myocardial infarction and PE.  The patient will be scheduled for an  outpatient  Myoview and follow-up with Dr. Ladona Ridgel.  The patient has been  advised on low-cholesterol diet and to track blood pressure at home and  record to bring to follow-up appointments.   DISCHARGE LABS:  TSH 1.835, cholesterol 196, lipids 96, HDL 36, LDL 141.  Cardiac enzymes:  Troponin 0.02, less than 0.01 and less than 0.01,  respectively.  Sodium 141, potassium 3.7, chloride 108, CO2 28, glucose  94, BUN 19, creatinine 0.77.  Hemoglobin 11.9, hematocrit 34.9, white  blood cells 4.1, platelets 153.  D-dimer less than 0.22.  Chest x-ray,  dated December 16, 2008, revealing stable exam with no active disease.  EKG  on discharge:  Normal sinus rhythm, ventricular rate of 71 beats per  minute with no evidence of ischemia.   DISCHARGE VITAL SIGNS:  Blood pressure 113/73, heart rate 64,  respirations 16, temperature 97.8, O2 sat 99% on room air.  Patient's  weight 160.6 pounds.   DISCHARGE MEDICATIONS:  1. Labetalol 200 mg twice a day.  2. Aspirin 325 daily.  3. Micardis 80 mg daily.   ALLERGIES:  To SULFA.   FOLLOW-UP  PLANS AND APPOINTMENTS:  1. The patient is scheduled for a stress Myoview with [facility] on December 23, 2008 at 12:30 p.m.  2. The patient is to follow up with Dr. Lewayne Bunting on March 18 at      11:15 a.m.  3. The patient will follow up with Dr. Birdie Sons for continued      medical management.  4. The patient has been advised to take blood pressure twice a day and      record and bring to appointment.  5. The patient will need to have repeat cholesterol level in 3 months      with addition of statin, if necessary.  6. The patient has been advised to be n.p.o. after midnight the night      before stress Myoview.   Time spent with the patient, to include physician time, 40 minutes.      Bettey Mare. Lyman Bishop, NP      Madolyn Frieze. Jens Som, MD, [facility]  Electronically Signed    KML/MEDQ  D:  12/17/2008  T:  12/17/2008  Job:  [id number]   cc:   Valetta Mole. Swords,  MD  [address] [place]  [place] [address]

## 2011-03-20 ENCOUNTER — Ambulatory Visit: Payer: Self-pay | Admitting: Internal Medicine

## 2011-04-26 ENCOUNTER — Encounter: Payer: Self-pay | Admitting: Internal Medicine

## 2011-05-02 ENCOUNTER — Ambulatory Visit: Payer: Self-pay | Admitting: Internal Medicine

## 2011-06-01 ENCOUNTER — Other Ambulatory Visit: Payer: Self-pay | Admitting: *Deleted

## 2011-07-12 LAB — URINE CULTURE: Colony Count: 15000

## 2011-07-12 LAB — CBC
HCT: 35.9 — ABNORMAL LOW
Hemoglobin: 12.3
MCHC: 34.3
MCV: 85.9
Platelets: 178
RBC: 4.17
RDW: 13
WBC: 5.4

## 2011-07-12 LAB — DIFFERENTIAL
Basophils Absolute: 0
Basophils Relative: 1
Eosinophils Absolute: 0.1
Eosinophils Relative: 1
Lymphocytes Relative: 25
Lymphs Abs: 1.3
Monocytes Absolute: 0.3
Monocytes Relative: 5
Neutro Abs: 3.6
Neutrophils Relative %: 68

## 2011-07-12 LAB — URINALYSIS, ROUTINE W REFLEX MICROSCOPIC
Bilirubin Urine: NEGATIVE
Glucose, UA: NEGATIVE
Hgb urine dipstick: NEGATIVE
Ketones, ur: NEGATIVE
Nitrite: NEGATIVE
Protein, ur: NEGATIVE
Specific Gravity, Urine: 1.006
Urobilinogen, UA: 0.2
pH: 7

## 2011-07-12 LAB — COMPREHENSIVE METABOLIC PANEL
ALT: 19
AST: 17
Albumin: 4.5
Alkaline Phosphatase: 66
BUN: 16
CO2: 25
Calcium: 9.1
Chloride: 107
Creatinine, Ser: 0.74
GFR calc Af Amer: >60
GFR calc non Af Amer: >60
Glucose, Bld: 116 — ABNORMAL HIGH
Potassium: 3.4 — ABNORMAL LOW
Sodium: 141
Total Bilirubin: 0.4
Total Protein: 7.1

## 2011-07-19 ENCOUNTER — Other Ambulatory Visit: Payer: Self-pay | Admitting: Internal Medicine

## 2011-07-20 ENCOUNTER — Ambulatory Visit: Payer: Self-pay | Admitting: Internal Medicine

## 2011-10-11 ENCOUNTER — Encounter (HOSPITAL_COMMUNITY): Payer: Self-pay | Admitting: *Deleted

## 2011-10-11 ENCOUNTER — Other Ambulatory Visit: Payer: Self-pay

## 2011-10-11 ENCOUNTER — Observation Stay (HOSPITAL_COMMUNITY)
Admission: EM | Admit: 2011-10-11 | Discharge: 2011-10-12 | Disposition: A | Discharge status: Home / Self Care | Payer: BC Managed Care – PPO | Source: Ambulatory Visit | Attending: Internal Medicine | Admitting: Internal Medicine

## 2011-10-11 ENCOUNTER — Emergency Department (HOSPITAL_COMMUNITY): Payer: BC Managed Care – PPO

## 2011-10-11 DIAGNOSIS — F411 Generalized anxiety disorder: Secondary | ICD-10-CM | POA: Insufficient documentation

## 2011-10-11 DIAGNOSIS — J309 Allergic rhinitis, unspecified: Secondary | ICD-10-CM | POA: Insufficient documentation

## 2011-10-11 DIAGNOSIS — R079 Chest pain, unspecified: Secondary | ICD-10-CM | POA: Diagnosis present

## 2011-10-11 DIAGNOSIS — I10 Essential (primary) hypertension: Secondary | ICD-10-CM | POA: Insufficient documentation

## 2011-10-11 DIAGNOSIS — R110 Nausea: Secondary | ICD-10-CM | POA: Diagnosis present

## 2011-10-11 DIAGNOSIS — R0789 Other chest pain: Principal | ICD-10-CM | POA: Insufficient documentation

## 2011-10-11 HISTORY — DX: Other complications of anesthesia, initial encounter: T88.59XA

## 2011-10-11 HISTORY — DX: Other specified postprocedural states: Z98.890

## 2011-10-11 HISTORY — DX: Anxiety disorder, unspecified: F41.9

## 2011-10-11 HISTORY — DX: Angina pectoris, unspecified: I20.9

## 2011-10-11 HISTORY — DX: Bronchitis, not specified as acute or chronic: J40

## 2011-10-11 HISTORY — DX: Nausea with vomiting, unspecified: R11.2

## 2011-10-11 HISTORY — DX: Adverse effect of unspecified anesthetic, initial encounter: T41.45XA

## 2011-10-11 HISTORY — DX: Cardiac arrhythmia, unspecified: I49.9

## 2011-10-11 HISTORY — DX: Pneumonia, unspecified organism: J18.9

## 2011-10-11 HISTORY — DX: Shortness of breath: R06.02

## 2011-10-11 LAB — DIFFERENTIAL
Basophils Absolute: 0 10*3/uL (ref 0.0–0.1)
Basophils Relative: 0 % (ref 0–1)
Eosinophils Absolute: 0.1 10*3/uL (ref 0.0–0.7)
Eosinophils Relative: 2 % (ref 0–5)
Lymphocytes Relative: 31 % (ref 12–46)
Lymphs Abs: 1.7 10*3/uL (ref 0.7–4.0)
Monocytes Absolute: 0.4 10*3/uL (ref 0.1–1.0)
Monocytes Relative: 8 % (ref 3–12)
Neutro Abs: 3.1 10*3/uL (ref 1.7–7.7)
Neutrophils Relative %: 59 % (ref 43–77)

## 2011-10-11 LAB — COMPREHENSIVE METABOLIC PANEL
ALT: 15 U/L (ref 0–35)
AST: 15 U/L (ref 0–37)
Albumin: 4.1 g/dL (ref 3.5–5.2)
Alkaline Phosphatase: 72 U/L (ref 39–117)
BUN: 18 mg/dL (ref 6–23)
CO2: 25 mEq/L (ref 19–32)
Calcium: 9.5 mg/dL (ref 8.4–10.5)
Chloride: 105 mEq/L (ref 96–112)
Creatinine, Ser: 0.75 mg/dL (ref 0.50–1.10)
GFR calc Af Amer: >90 mL/min (ref >90)
GFR calc non Af Amer: >90 mL/min (ref >90)
Glucose, Bld: 102 mg/dL — ABNORMAL HIGH (ref 70–99)
Potassium: 3.5 mEq/L (ref 3.5–5.1)
Sodium: 141 mEq/L (ref 135–145)
Total Bilirubin: 0.2 mg/dL — ABNORMAL LOW (ref 0.3–1.2)
Total Protein: 7 g/dL (ref 6.0–8.3)

## 2011-10-11 LAB — CBC
HCT: 35.9 % — ABNORMAL LOW (ref 36.0–46.0)
Hemoglobin: 12.2 g/dL (ref 12.0–15.0)
MCH: 29.5 pg (ref 26.0–34.0)
MCHC: 34 g/dL (ref 30.0–36.0)
MCV: 86.9 fL (ref 78.0–100.0)
Platelets: 173 10*3/uL (ref 150–400)
RBC: 4.13 MIL/uL (ref 3.87–5.11)
RDW: 12.8 % (ref 11.5–15.5)
WBC: 5.4 10*3/uL (ref 4.0–10.5)

## 2011-10-11 LAB — CK TOTAL AND CKMB (NOT AT ARMC)
CK, MB: 2 ng/mL (ref 0.3–4.0)
Relative Index: INVALID (ref 0.0–2.5)
Total CK: 59 U/L (ref 7–177)

## 2011-10-11 LAB — POCT I-STAT TROPONIN I: Troponin i, poc: 0 ng/mL (ref 0.00–0.08)

## 2011-10-11 MED ORDER — ASPIRIN 81 MG PO CHEW
CHEWABLE_TABLET | ORAL | Status: AC
  Administered 2011-10-11: 324 mg via ORAL
  Filled 2011-10-11: qty 4

## 2011-10-11 MED ORDER — NITROGLYCERIN 2 % TD OINT
0.5000 [in_us] | TOPICAL_OINTMENT | Freq: Once | TRANSDERMAL | Status: AC
  Administered 2011-10-11: 0.5 [in_us] via TOPICAL

## 2011-10-11 MED ORDER — ASPIRIN 81 MG PO CHEW
324.0000 mg | CHEWABLE_TABLET | Freq: Once | ORAL | Status: AC
  Administered 2011-10-11: 324 mg via ORAL

## 2011-10-11 MED ORDER — NITROGLYCERIN 2 % TD OINT
TOPICAL_OINTMENT | TRANSDERMAL | Status: AC
  Filled 2011-10-11: qty 1

## 2011-10-11 NOTE — ED Provider Notes (Addendum)
History     CSN: [id number]  Arrival date & time 10/11/11  2001   First MD Initiated Contact with Patient 10/11/11 2135      Chief Complaint  Patient presents with  . Chest Pain    (Consider location/radiation/quality/duration/timing/severity/associated sxs/prior treatment) Patient is a 55 y.o. female presenting with chest pain. The history is provided by the patient.  Chest Pain Episode onset: One week ago. Chest pain occurs constantly (The pain waxes and wanes). The chest pain is unchanged. The pain is associated with breathing. The quality of the pain is described as aching and sharp. The pain does not radiate. Pertinent negatives for primary symptoms include no fever, no fatigue, no cough, no nausea and no vomiting. She tried nothing for the symptoms. Risk factors: Hypertension. Unknown cholesterol level. Unknown type of cardiac disease in an elderly grandmother.            Past Medical History  Diagnosis Date  . Hypertension   . GERD (gastroesophageal reflux disease)   . Hiatal hernia   . Internal hemorrhoids   . Chronic gastritis   . Abdominal pain     right upper quadrant  . Nausea   . Allergic rhinitis   . Palpitations   . Hemorrhoid     Past Surgical History  Procedure Date  . Abdominal hysterectomy     due to endometriosis  . Nasal septum surgery   . Oophorectomy     ([place]/bso) (off estrogen 2005)    Family History  Problem Relation Age of Onset  . Hypertension Mother   . Heart disease      grandparent  . Ovarian cancer      aunt  . Colon cancer Neg Hx     History  Substance Use Topics  . Smoking status: Former Smoker -- 0.5 packs/day for 3 years    Types: Cigarettes    Quit date: 12/22/1978  . Smokeless tobacco: Not on file  . Alcohol Use: Yes    OB History    Grav Para Term Preterm Abortions TAB SAB Ect Mult Living                  Review of Systems  Constitutional: Negative for fever and fatigue.  Respiratory: Negative for  cough.   Cardiovascular: Positive for chest pain.  Gastrointestinal: Negative for nausea and vomiting.  All other systems reviewed and are negative.    Allergies  Penicillins and Sulfamethoxazole  Home Medications   Current Outpatient Rx  Name Route Sig Dispense Refill  . AMLODIPINE BESYLATE 5 MG PO TABS  TAKE 1 TABLET DAILY 90 tablet 3  . ASPIRIN 81 MG PO TBEC Oral Take 81 mg by mouth as needed. For heart per doctor. Per patient    . CETIRIZINE HCL 10 MG PO TABS Oral Take 10 mg by mouth daily as needed. For allergy    . LABETALOL HCL 200 MG PO TABS  TAKE 1 TABLET TWICE A DAY 180 tablet 2  . MICARDIS 80 MG PO TABS  TAKE 1 TABLET DAILY 90 tablet 2  . OMEPRAZOLE 20 MG PO CPDR Oral Take 20 mg by mouth daily as needed. Upset stomach    . NITROGLYCERIN 0.4 MG SL SUBL Sublingual Place 0.4 mg under the tongue every 5 (five) minutes as needed.        BP 170/75  Pulse 82  Temp 98.3 F (36.8 C)  Resp 20  SpO2 100%  Physical Exam  Nursing note and vitals  reviewed. Constitutional: She is oriented to person, place, and time. She appears well-developed and well-nourished.  HENT:  Head: Normocephalic and atraumatic.  Eyes: Conjunctivae and EOM are normal. Pupils are equal, round, and reactive to light.  Neck: Normal range of motion and phonation normal. Neck supple.  Cardiovascular: Normal rate, regular rhythm and intact distal pulses.   Pulmonary/Chest: Effort normal and breath sounds normal. She exhibits no tenderness.       Mild left anterior chest wall tenderness  Abdominal: Soft. She exhibits no distension. There is no tenderness. There is no guarding.  Musculoskeletal: Normal range of motion.  Neurological: She is alert and oriented to person, place, and time. She has normal strength and normal reflexes. She exhibits normal muscle tone.  Skin: Skin is warm and dry.  Psychiatric: Her behavior is normal. Judgment and thought content normal.       She is anxious    ED Course    Procedures (including critical care time)  23:10- low-grade for reevaluation. The patient developed worsening chest pain to 5/10. The nurse placed oxygen on her at 2 L and the chest pain, decreased to 1-2/10. Patient continues to assert that her chest pain is worse with deep breathing and states that the pain cuts off her breath. She continues to deny shortness of breath. She  had a stress test done 2 years ago by her cardiologist. There was no inducible ischemia. Additional treatment ordered-full dose aspirin, and nitroglycerin paste. We'll arrange for admission for rule out and possible provocative testing versus catheterization.  Labs Reviewed  CBC - Abnormal; Notable for the following:    HCT 35.9 (*)    All other components within normal limits  COMPREHENSIVE METABOLIC PANEL - Abnormal; Notable for the following:    Glucose, Bld 102 (*)    Total Bilirubin 0.2 (*)    All other components within normal limits  DIFFERENTIAL  CK TOTAL AND CKMB  POCT I-STAT TROPONIN I  I-STAT TROPONIN I   Dg Chest 2 View  10/11/2011  *RADIOLOGY REPORT*  Clinical Data:  Chest pain, abdominal pain and nausea.  CHEST - 2 VIEW  Comparison: 02/14/2011  Findings: The heart size and mediastinal contours are within normal limits.  Both lungs are clear.  The visualized skeletal structures are unremarkable.  IMPRESSION: No active disease.  Original Report Authenticated By: Reola Calkins, M.D.    Date: 10/11/2011  Rate: 77  Rhythm: normal sinus rhythm  QRS Axis: normal  Intervals: normal  ST/T Wave abnormalities: nonspecific T wave changes  Conduction Disutrbances:none  Narrative Interpretation:   Old EKG Reviewed: none available   1. Chest pain       MDM  Nonspecific chest pain with negative evaluation in the ED, however, chest pain is ongoing. Patient has unknown cholesterol level, and elevated blood pressure. She is not a candidate for overnight ED rule out since she has persistent  pain.        Flint Melter, MD 10/11/11 2359  Flint Melter, MD 10/11/11 [phone]

## 2011-10-11 NOTE — ED Notes (Signed)
Shanaya Schneck (spouse) [phone]

## 2011-10-11 NOTE — ED Notes (Signed)
The pt has had some mid-chest pain for one week with some transient dizziness and some sob.  No previous history of cardiac  Problems.  She had some tests 1-2 years ago and she had no cardiac problems.

## 2011-10-12 ENCOUNTER — Encounter (HOSPITAL_COMMUNITY): Payer: Self-pay | Admitting: General Practice

## 2011-10-12 DIAGNOSIS — J40 Bronchitis, not specified as acute or chronic: Secondary | ICD-10-CM

## 2011-10-12 DIAGNOSIS — R072 Precordial pain: Secondary | ICD-10-CM

## 2011-10-12 DIAGNOSIS — R079 Chest pain, unspecified: Secondary | ICD-10-CM | POA: Diagnosis present

## 2011-10-12 DIAGNOSIS — J189 Pneumonia, unspecified organism: Secondary | ICD-10-CM

## 2011-10-12 DIAGNOSIS — R0602 Shortness of breath: Secondary | ICD-10-CM

## 2011-10-12 HISTORY — DX: Bronchitis, not specified as acute or chronic: J40

## 2011-10-12 HISTORY — DX: Pneumonia, unspecified organism: J18.9

## 2011-10-12 HISTORY — DX: Shortness of breath: R06.02

## 2011-10-12 LAB — CARDIAC PANEL(CRET KIN+CKTOT+MB+TROPI)
CK, MB: 1.6 ng/mL (ref 0.3–4.0)
Relative Index: INVALID (ref 0.0–2.5)
Total CK: 41 U/L (ref 7–177)
Troponin I: <0.3 ng/mL (ref <0.30)

## 2011-10-12 LAB — TSH: TSH: 1.694 u[IU]/mL (ref 0.350–4.500)

## 2011-10-12 MED ORDER — NITROGLYCERIN 0.4 MG SL SUBL: 0.4000 mg | SUBLINGUAL_TABLET | SUBLINGUAL | Status: DC | PRN

## 2011-10-12 MED ORDER — LEVOFLOXACIN 500 MG PO TABS: 500.0000 mg | ORAL_TABLET | Freq: Every day | ORAL | Status: AC

## 2011-10-12 MED ORDER — MONTELUKAST SODIUM 10 MG PO TABS: 10.0000 mg | ORAL_TABLET | Freq: Every day | ORAL | Status: DC

## 2011-10-12 MED ORDER — ZOLPIDEM TARTRATE 5 MG PO TABS: 10.0000 mg | ORAL_TABLET | Freq: Every evening | ORAL | Status: DC | PRN

## 2011-10-12 MED ORDER — PANTOPRAZOLE SODIUM 40 MG PO TBEC
40.0000 mg | DELAYED_RELEASE_TABLET | Freq: Every day | ORAL | Status: DC
  Administered 2011-10-12: 40 mg via ORAL
  Filled 2011-10-12: qty 1

## 2011-10-12 MED ORDER — SODIUM CHLORIDE 0.9 % IV SOLN: 250.0000 mL | INTRAVENOUS | Status: DC | PRN

## 2011-10-12 MED ORDER — NITROGLYCERIN 2 % TD OINT
0.5000 [in_us] | TOPICAL_OINTMENT | Freq: Four times a day (QID) | TRANSDERMAL | Status: DC
  Administered 2011-10-12 (×3): 0.5 [in_us] via TOPICAL
  Filled 2011-10-12: qty 30

## 2011-10-12 MED ORDER — ONDANSETRON HCL 4 MG PO TABS: 4.0000 mg | ORAL_TABLET | Freq: Four times a day (QID) | ORAL | Status: DC | PRN

## 2011-10-12 MED ORDER — MORPHINE SULFATE 2 MG/ML IJ SOLN: 2.0000 mg | INTRAMUSCULAR | Status: DC | PRN

## 2011-10-12 MED ORDER — ALPRAZOLAM 0.25 MG PO TABS: 0.2500 mg | ORAL_TABLET | Freq: Every evening | ORAL | Status: AC | PRN

## 2011-10-12 MED ORDER — OLMESARTAN MEDOXOMIL 40 MG PO TABS
40.0000 mg | ORAL_TABLET | Freq: Every day | ORAL | Status: DC
  Administered 2011-10-12: 40 mg via ORAL
  Filled 2011-10-12: qty 1

## 2011-10-12 MED ORDER — DOCUSATE SODIUM 100 MG PO CAPS
100.0000 mg | ORAL_CAPSULE | Freq: Two times a day (BID) | ORAL | Status: DC
  Administered 2011-10-12: 100 mg via ORAL

## 2011-10-12 MED ORDER — LABETALOL HCL 200 MG PO TABS
200.0000 mg | ORAL_TABLET | Freq: Two times a day (BID) | ORAL | Status: DC
  Administered 2011-10-12: 200 mg via ORAL
  Filled 2011-10-12 (×2): qty 1

## 2011-10-12 MED ORDER — SODIUM CHLORIDE 0.9 % IJ SOLN: 3.0000 mL | INTRAMUSCULAR | Status: DC | PRN

## 2011-10-12 MED ORDER — ASPIRIN EC 325 MG PO TBEC
325.0000 mg | DELAYED_RELEASE_TABLET | Freq: Every day | ORAL | Status: DC
  Administered 2011-10-12: 325 mg via ORAL
  Filled 2011-10-12: qty 1

## 2011-10-12 MED ORDER — SODIUM CHLORIDE 0.9 % IJ SOLN
3.0000 mL | Freq: Two times a day (BID) | INTRAMUSCULAR | Status: DC
Start: 2011-10-12 — End: 2011-10-12
  Administered 2011-10-12 (×2): 3 mL via INTRAVENOUS

## 2011-10-12 MED ORDER — AMLODIPINE BESYLATE 5 MG PO TABS
5.0000 mg | ORAL_TABLET | Freq: Every day | ORAL | Status: DC
  Filled 2011-10-12: qty 1

## 2011-10-12 MED ORDER — ONDANSETRON HCL 4 MG/2ML IJ SOLN: 4.0000 mg | Freq: Four times a day (QID) | INTRAMUSCULAR | Status: DC | PRN

## 2011-10-12 NOTE — Progress Notes (Signed)
*  PRELIMINARY RESULTS* Echocardiogram 2D Echocardiogram has been performed.  Clide Deutscher RDCS 10/12/2011, 11:01 AM

## 2011-10-12 NOTE — H&P (Signed)
PCP:   Judie Petit, MD, MD   Chief Complaint: Intermittent chest pain for a week   HPI: Sheri Martinez is an 55 y.o. female with history of hypertension, anxiety, GERD, presents to the emergency room with substernal chest pain intermittently for a week. She states that she had an upper respiratory infection symptoms last week, and since then she feels burning sensation in her chest. It seems as if she had a hard time getting deep breath. She also has congestion, and this evening had some vertiginous feeling along with some nausea. She denied any fever, chills, productive cough, pleuritic chest pain, black stool, bloody stool, or diaphoresis. She had a negative stress test approximately 2 years ago. Concerning that she might have had a heart attack, she came to the emergency room for evaluation. In the emergency room, she  has a normal EKG and normal initial cardiac markers. Her chest x-ray is clear. Hospitalist was asked to admit her for cardiac rule out.   Review of System:  The patient denies anorexia, fever, weight loss,, vision loss, decreased hearing, hoarseness,  syncope, dyspnea on exertion, peripheral edema, balance deficits, hemoptysis, abdominal pain, melena, hematochezia, severe indigestion/heartburn, hematuria, incontinence, genital sores, muscle weakness, suspicious skin lesions, transient blindness, difficulty walking, depression, unusual weight change, abnormal bleeding, enlarged lymph nodes, angioedema, and breast masses.    Past Medical History  Diagnosis Date  . Hypertension   . GERD (gastroesophageal reflux disease)   . Hiatal hernia   . Internal hemorrhoids   . Chronic gastritis   . Abdominal pain     right upper quadrant  . Nausea   . Allergic rhinitis   . Hemorrhoid   . Complication of anesthesia   . PONV (postoperative nausea and vomiting)   . Dysrhythmia     palpitation  . Angina   . Bronchitis 10/12/11    "just got over it'  . Pneumonia 10/12/11    "just  got over it"  . Shortness of breath 10/12/11    "at any time; can't relate it to anything; here w/chest tightness; just got over pneumonia/bronchitis"  . Anxiety     Past Surgical History  Procedure Date  . Nasal septum surgery 1990's  . Oophorectomy     ([place]) (off estrogen 2005)  . Abdominal hysterectomy ~ 1989    TAH; BSO; due to endometriosis  . Cholecystectomy 02/2011  . Dilation and curettage of uterus 1980's    Medications:  HOME MEDS: Prior to Admission medications   Medication Sig Start Date End Date Taking? Authorizing Provider  amLODipine (NORVASC) 5 MG tablet TAKE 1 TABLET DAILY 07/19/11  Yes Lewayne Bunting, MD  aspirin (ASPIR-81) 81 MG EC tablet Take 81 mg by mouth as needed. For heart per doctor. Per patient   Yes Historical Provider, MD  cetirizine (ZYRTEC) 10 MG tablet Take 10 mg by mouth daily as needed. For allergy   Yes Historical Provider, MD  labetalol (NORMODYNE) 200 MG tablet TAKE 1 TABLET TWICE A DAY 02/12/11  Yes Lewayne Bunting, MD  MICARDIS 80 MG tablet TAKE 1 TABLET DAILY 02/12/11  Yes Lewayne Bunting, MD  omeprazole (PRILOSEC) 20 MG capsule Take 20 mg by mouth daily as needed. Upset stomach   Yes Historical Provider, MD  nitroGLYCERIN (NITROSTAT) 0.4 MG SL tablet Place 0.4 mg under the tongue every 5 (five) minutes as needed.      Historical Provider, MD     Allergies:  Allergies  Allergen Reactions  . Penicillins  dizzy  . Sulfamethoxazole     REACTION: rash    Social History:   reports that she quit smoking about 32 years ago. Her smoking use included Cigarettes. She has a 1.5 pack-year smoking history. She has never used smokeless tobacco. She reports that she drinks alcohol. She reports that she does not use illicit drugs.  Family History: Family History  Problem Relation Age of Onset  . Hypertension Mother   . Heart disease      grandparent  . Ovarian cancer      aunt  . Colon cancer Neg Hx      Physical Exam: Filed Vitals:    10/11/11 2154 10/11/11 2200 10/11/11 2215 10/12/11 0101  BP: 159/74 155/72 170/75 98/59  Pulse: 84 71 82 72  Temp:    98.4 F (36.9 C)  TempSrc:    Oral  Resp: 20   16  SpO2: 99% 97% 100% 98%   Blood pressure 98/59, pulse 72, temperature 98.4 F (36.9 C), temperature source Oral, resp. rate 16, SpO2 98.00%.  GEN:  Pleasant person lying in the stretcher in no acute distress; cooperative with exam. When I see her she is pain-free  PSYCH:  alert and oriented x4; does appear anxious does not appear depressed; affect is normal HEENT: Mucous membranes pink and anicteric; PERRLA; EOM intact; no cervical lymphadenopathy nor thyromegaly or carotid bruit; no JVD; Breasts:: Not examined CHEST WALL: No tenderness CHEST: Normal respiration, clear to auscultation bilaterally HEART: Regular rate and rhythm; no murmurs rubs or gallops BACK: No kyphosis or scoliosis; no CVA tenderness ABDOMEN: Obese, soft non-tender; no masses, no organomegaly, normal abdominal bowel sounds; no pannus; no intertriginous candida. Rectal Exam: Not done EXTREMITIES: No bone or joint deformity; age-appropriate arthropathy of the hands and knees; no edema; no ulcerations. Genitalia: not examined PULSES: 2+ and symmetric SKIN: Normal hydration no rash or ulceration CNS: Cranial nerves 2-12 grossly intact no focal neurologic deficit   Labs & Imaging Results for orders placed during the hospital encounter of 10/11/11 (from the past 48 hour(s))  CBC     Status: Abnormal   Collection Time   10/11/11  8:34 PM      Component Value Range Comment   WBC 5.4  4.0 - 10.5 (K/uL)    RBC 4.13  3.87 - 5.11 (MIL/uL)    Hemoglobin 12.2  12.0 - 15.0 (g/dL)    HCT 16.1 (*) 09.6 - 46.0 (%)    MCV 86.9  78.0 - 100.0 (fL)    MCH 29.5  26.0 - 34.0 (pg)    MCHC 34.0  30.0 - 36.0 (g/dL)    RDW 04.5  40.9 - 81.1 (%)    Platelets 173  150 - 400 (K/uL)   DIFFERENTIAL     Status: Normal   Collection Time   10/11/11  8:34 PM      Component  Value Range Comment   Neutrophils Relative 59  43 - 77 (%)    Neutro Abs 3.1  1.7 - 7.7 (K/uL)    Lymphocytes Relative 31  12 - 46 (%)    Lymphs Abs 1.7  0.7 - 4.0 (K/uL)    Monocytes Relative 8  3 - 12 (%)    Monocytes Absolute 0.4  0.1 - 1.0 (K/uL)    Eosinophils Relative 2  0 - 5 (%)    Eosinophils Absolute 0.1  0.0 - 0.7 (K/uL)    Basophils Relative 0  0 - 1 (%)    Basophils  Absolute 0.0  0.0 - 0.1 (K/uL)   CK TOTAL AND CKMB     Status: Normal   Collection Time   10/11/11  8:34 PM      Component Value Range Comment   Total CK 59  7 - 177 (U/L)    CK, MB 2.0  0.3 - 4.0 (ng/mL)    Relative Index RELATIVE INDEX IS INVALID  0.0 - 2.5    COMPREHENSIVE METABOLIC PANEL     Status: Abnormal   Collection Time   10/11/11  8:34 PM      Component Value Range Comment   Sodium 141  135 - 145 (mEq/L)    Potassium 3.5  3.5 - 5.1 (mEq/L)    Chloride 105  96 - 112 (mEq/L)    CO2 25  19 - 32 (mEq/L)    Glucose, Bld 102 (*) 70 - 99 (mg/dL)    BUN 18  6 - 23 (mg/dL)    Creatinine, Ser 4.09  0.50 - 1.10 (mg/dL)    Calcium 9.5  8.4 - 10.5 (mg/dL)    Total Protein 7.0  6.0 - 8.3 (g/dL)    Albumin 4.1  3.5 - 5.2 (g/dL)    AST 15  0 - 37 (U/L)    ALT 15  0 - 35 (U/L)    Alkaline Phosphatase 72  39 - 117 (U/L)    Total Bilirubin 0.2 (*) 0.3 - 1.2 (mg/dL)    GFR calc non Af Amer >90  >90 (mL/min)    GFR calc Af Amer >90  >90 (mL/min)   POCT I-STAT TROPONIN I     Status: Normal   Collection Time   10/11/11  8:58 PM      Component Value Range Comment   Troponin i, poc 0.00  0.00 - 0.08 (ng/mL)    Comment 3             Dg Chest 2 View  10/11/2011  *RADIOLOGY REPORT*  Clinical Data:  Chest pain, abdominal pain and nausea.  CHEST - 2 VIEW  Comparison: 02/14/2011  Findings: The heart size and mediastinal contours are within normal limits.  Both lungs are clear.  The visualized skeletal structures are unremarkable.  IMPRESSION: No active disease.  Original Report Authenticated By: Reola Calkins,  M.D.      Assessment Present on Admission:  .NAUSEA .Chest pain at rest Anxiety Hypertension   PLAN:  55 year old female with history of hypertension, borderline hypercholesterolemia , anxiety present with atypical chest pain and normal initial workup. Will admit her for rule out with serial CPKs and troponins. I will obtain an echo to assess for wall motion. If she rule out she can followup with her primary care physician for outpatient stress test. Risk modification discussed at length. She will be placed on aspirin, and I will continue her antihypertensive medications. She is stable, full code, and will be admitted to telemetry under triad hospitalist service.   Other plans as per orders.   Ferraz 10/12/2011, 1:33 AM

## 2011-10-12 NOTE — ED Notes (Signed)
[id number] READY

## 2011-10-12 NOTE — Discharge Summary (Signed)
DISCHARGE SUMMARY  Sheri Martinez  MR#: [id number]  DOB:11/03/1955  Date of Admission: 10/11/2011 Date of Discharge: 10/12/2011  Attending Physician:Nivan Melendrez  Patient's NWG:[id number] Sheri Cooter, MD, MD  Consults:   Discharge Diagnoses: #1 atypical chest pain-negative cardiac enzyme questionable GERD #2 allergic rhinitis with questionable superimposed bacterial infection #3 hypertension #4 questionable anxiety disorder  Present on Admission:  .NAUSEA .Chest pain at rest    Current Discharge Medication List    START taking these medications   Details  ALPRAZolam (XANAX) 0.25 MG tablet Take 1 tablet (0.25 mg total) by mouth at bedtime as needed for sleep or anxiety. Qty: 30 tablet, Refills: 0    levofloxacin (LEVAQUIN) 500 MG tablet Take 1 tablet (500 mg total) by mouth daily. Qty: 7 tablet, Refills: 0    montelukast (SINGULAIR) 10 MG tablet Take 1 tablet (10 mg total) by mouth at bedtime. Qty: 30 tablet, Refills: 0      CONTINUE these medications which have NOT CHANGED   Details  amLODipine (NORVASC) 5 MG tablet TAKE 1 TABLET DAILY Qty: 90 tablet, Refills: 3    aspirin (ASPIR-81) 81 MG EC tablet Take 81 mg by mouth as needed. For heart per doctor. Per patient    cetirizine (ZYRTEC) 10 MG tablet Take 10 mg by mouth daily as needed. For allergy    labetalol (NORMODYNE) 200 MG tablet TAKE 1 TABLET TWICE A DAY Qty: 180 tablet, Refills: 2    MICARDIS 80 MG tablet TAKE 1 TABLET DAILY Qty: 90 tablet, Refills: 2    omeprazole (PRILOSEC) 20 MG capsule Take 20 mg by mouth daily as needed. Upset stomach      STOP taking these medications     nitroGLYCERIN (NITROSTAT) 0.4 MG SL tablet           Hospital Course: Patient is a 55 year old Caucasian female with history of hypertension, GERD, and had to hang and was admitted to the hospital on 10/12/2011 weeks one week history of intermittent chest pain which is said to retrosternal and burning in nature.  Patient also said to have cough that was nonproductive of sputum. She denied any fever, chills or Rigors. She also denied any palpitation, PND or orthopnea. Examination of patient with essential unremarkable. EKG normal and cardiac enzymes was essentially normal. Chest x-ray did not show any cardiopulmonary process. Patient was however admitted to be ruled out for ACS. Patient was admitted to telemetry, given gentle IV fluid and also aspirin. The medication given to patient include IV morphine and nitroglycerin sublingual. She was also given Zofran for nausea. She was restarted on her blood pressure pills which include amlodipine Benicar and labetalol. Also given to patient was Ambien for sleep.  patient was seen by me for the first time in this admission today 10/12/2011. During this interaction, she denied any chest pain or shortness of breath. She also denied any systemic symptoms i.e. no fever no chills no Riegler. She had a complaint of being anxious and chronically irritating cough. Examination of patient  was essentially unremarkable. I had an extensive discussion with patient and family about patient's workup which was negative, and all were agreable with the patient being treated for allergic rhinitis and also for possible superimposed bacterial infection. Plan is for patient to be discharge home today on Levaquin, Xanax and Singulair.    Day of Discharge BP 118/60  Pulse 69  Temp(Src) 97.4 F (36.3 C) (Oral)  Resp 16  Ht 5\' 4"  (1.626 m)  Wt 75.297 kg (166 lb)  BMI 28.49 kg/m2  SpO2 99%  Physical Exam:vitals as above heent-no pallor Neck-no jvd Chest-clear cvs-s1and s2 Abd-soft,non tender,organs not palpable and bowel sounds are positive Ext-no pedal edema Neuro-non focal Skin-no ecchymoses  Results for orders placed during the hospital encounter of 10/11/11 (from the past 24 hour(s))  CBC     Status: Abnormal   Collection Time   10/11/11  8:34 PM      Component Value Range    WBC 5.4  4.0 - 10.5 (K/uL)   RBC 4.13  3.87 - 5.11 (MIL/uL)   Hemoglobin 12.2  12.0 - 15.0 (g/dL)   HCT 16.1 (*) 09.6 - 46.0 (%)   MCV 86.9  78.0 - 100.0 (fL)   MCH 29.5  26.0 - 34.0 (pg)   MCHC 34.0  30.0 - 36.0 (g/dL)   RDW 04.5  40.9 - 81.1 (%)   Platelets 173  150 - 400 (K/uL)  DIFFERENTIAL     Status: Normal   Collection Time   10/11/11  8:34 PM      Component Value Range   Neutrophils Relative 59  43 - 77 (%)   Neutro Abs 3.1  1.7 - 7.7 (K/uL)   Lymphocytes Relative 31  12 - 46 (%)   Lymphs Abs 1.7  0.7 - 4.0 (K/uL)   Monocytes Relative 8  3 - 12 (%)   Monocytes Absolute 0.4  0.1 - 1.0 (K/uL)   Eosinophils Relative 2  0 - 5 (%)   Eosinophils Absolute 0.1  0.0 - 0.7 (K/uL)   Basophils Relative 0  0 - 1 (%)   Basophils Absolute 0.0  0.0 - 0.1 (K/uL)  CK TOTAL AND CKMB     Status: Normal   Collection Time   10/11/11  8:34 PM      Component Value Range   Total CK 59  7 - 177 (U/L)   CK, MB 2.0  0.3 - 4.0 (ng/mL)   Relative Index RELATIVE INDEX IS INVALID  0.0 - 2.5   COMPREHENSIVE METABOLIC PANEL     Status: Abnormal   Collection Time   10/11/11  8:34 PM      Component Value Range   Sodium 141  135 - 145 (mEq/L)   Potassium 3.5  3.5 - 5.1 (mEq/L)   Chloride 105  96 - 112 (mEq/L)   CO2 25  19 - 32 (mEq/L)   Glucose, Bld 102 (*) 70 - 99 (mg/dL)   BUN 18  6 - 23 (mg/dL)   Creatinine, Ser 9.14  0.50 - 1.10 (mg/dL)   Calcium 9.5  8.4 - 78.2 (mg/dL)   Total Protein 7.0  6.0 - 8.3 (g/dL)   Albumin 4.1  3.5 - 5.2 (g/dL)   AST 15  0 - 37 (U/L)   ALT 15  0 - 35 (U/L)   Alkaline Phosphatase 72  39 - 117 (U/L)   Total Bilirubin 0.2 (*) 0.3 - 1.2 (mg/dL)   GFR calc non Af Amer >90  >90 (mL/min)   GFR calc Af Amer >90  >90 (mL/min)  POCT I-STAT TROPONIN I     Status: Normal   Collection Time   10/11/11  8:58 PM      Component Value Range   Troponin i, poc 0.00  0.00 - 0.08 (ng/mL)   Comment 3           CARDIAC PANEL(CRET KIN+CKTOT+MB+TROPI)     Status: Normal   Collection  Time   10/12/11  9:24 AM  Component Value Range   Total CK 41  7 - 177 (U/L)   CK, MB 1.6  0.3 - 4.0 (ng/mL)   Troponin I <0.30  <0.30 (ng/mL)   Relative Index RELATIVE INDEX IS INVALID  0.0 - 2.5     Disposition: stable   Follow-up Appts: Discharge Orders    Future Orders Please Complete By Expires   Diet - low sodium heart healthy      Increase activity slowly      Discharge instructions      Comments:   Follow up with pcp 1-2weeks        Signed: Braelynne Garinger 10/12/2011, 1:32 PM

## 2011-10-15 NOTE — Progress Notes (Signed)
10/15/2011 Sheri Martinez SPARKS Case Management Note [phone]   Utilization review completed.  

## 2011-10-19 ENCOUNTER — Other Ambulatory Visit: Payer: Self-pay

## 2011-10-19 ENCOUNTER — Other Ambulatory Visit: Payer: Self-pay | Admitting: Internal Medicine

## 2011-10-19 MED ORDER — LABETALOL HCL 200 MG PO TABS: 200.0000 mg | ORAL_TABLET | Freq: Two times a day (BID) | ORAL | Status: DC

## 2011-10-19 MED ORDER — TELMISARTAN 80 MG PO TABS: 80.0000 mg | ORAL_TABLET | Freq: Every day | ORAL | Status: DC

## 2011-10-22 ENCOUNTER — Telehealth: Payer: Self-pay | Admitting: Internal Medicine

## 2011-10-22 NOTE — Telephone Encounter (Signed)
Pt aware of results.  Will contact cardiology for hypotensive issues

## 2011-10-22 NOTE — Telephone Encounter (Signed)
Pt stated MD office call her today.

## 2012-01-16 ENCOUNTER — Other Ambulatory Visit: Payer: Self-pay | Admitting: Internal Medicine

## 2012-01-16 MED ORDER — TELMISARTAN 80 MG PO TABS: 80.0000 mg | ORAL_TABLET | Freq: Every day | ORAL | Status: DC

## 2012-01-16 MED ORDER — LABETALOL HCL 200 MG PO TABS: 200.0000 mg | ORAL_TABLET | Freq: Two times a day (BID) | ORAL | Status: DC

## 2012-01-16 NOTE — Telephone Encounter (Signed)
RX Refill-Telmisartan (MICARDIS) 80 MG tablet               - Labetalol (NORMODYNE) 200 MG tablet    Patient needs 90 day Prescriptions for medications.  Verified pharmacy as [facility]. She can be reached at [phone] for additional information.

## 2012-01-28 ENCOUNTER — Telehealth: Payer: Self-pay | Admitting: Internal Medicine

## 2012-01-28 NOTE — Telephone Encounter (Signed)
Refill F/U -   labetalol (NORMODYNE) 200 MG tablet                -    telmisartan (MICARDIS) 80 MG tablet  Patient requested RX refill, order was sent out to [facility] on 01/16/12, but only received 30 instead of 90 day supply.  Patient request return call   at [phone].   Summary: Take 1 tablet (200 mg total) by mouth 2 (two) times daily., Starting 01/16/2012, Until Discontinued, Normal Dose, Route, Frequency: 200 mg, Oral, 2 times daily  Start: 01/16/2012  Ord/Sold: 01/16/2012 (O)  Report  Taking:  Long-term:   Pharmacy: [facility] - [place], [place] - [address]  Med Dose History  Change   Patient Sig: Take 1 tablet (200 mg total) by mouth 2 (two) times daily.   Ordered on: 01/16/2012   Authorized by: Marinus Maw   Dispense: 60 tablet   Med Comments: Patient needs to make appointment    Summary: Take 1 tablet (80 mg total) by mouth daily., Starting 01/16/2012, Until Discontinued, Normal Dose, Route, Frequency: 80 mg, Oral, Daily  Start: 01/16/2012  Ord/Sold: 01/16/2012 (O)  Report  Taking:  Long-term:   Pharmacy: [facility] - [place], [place] - [address]  Med Dose History  Change   Patient Sig: Take 1 tablet (80 mg total) by mouth daily.   Ordered on: 01/16/2012   Authorized by: Marinus Maw   Dispense: 30 tablet   Med Comments: Patient needs to make appointment

## 2012-01-31 NOTE — Telephone Encounter (Signed)
Advised patient she had to schedule an appt in order to receive a 90 day supply

## 2012-02-15 ENCOUNTER — Encounter: Payer: Self-pay | Admitting: Family Medicine

## 2012-02-15 ENCOUNTER — Ambulatory Visit (INDEPENDENT_AMBULATORY_CARE_PROVIDER_SITE_OTHER): Payer: BC Managed Care – PPO | Admitting: Family Medicine

## 2012-02-15 VITALS — BP 146/94 | HR 90 | Temp 98.1°F | Wt 160.0 lb

## 2012-02-15 DIAGNOSIS — I10 Essential (primary) hypertension: Secondary | ICD-10-CM

## 2012-02-15 DIAGNOSIS — W57XXXA Bitten or stung by nonvenomous insect and other nonvenomous arthropods, initial encounter: Secondary | ICD-10-CM

## 2012-02-15 DIAGNOSIS — T148XXA Other injury of unspecified body region, initial encounter: Secondary | ICD-10-CM

## 2012-02-15 DIAGNOSIS — R51 Headache: Secondary | ICD-10-CM

## 2012-02-15 MED ORDER — DOXYCYCLINE HYCLATE 100 MG PO CAPS: 100.0000 mg | ORAL_CAPSULE | Freq: Two times a day (BID) | ORAL | Status: AC

## 2012-02-15 NOTE — Progress Notes (Signed)
  Subjective:    Patient ID: Sheri Martinez, female    DOB: Jan 21, 1956, 56 y.o.   MRN: [id number]  HPI Here for 2 days of elevated BP and HA. No fevers. 3 days ago she pulled a tick off her leg, and she wonders if this could be related. No rashes or joint pains. Her BP is usually quite stable, but for several days it has been running 150s or 160s over 90s.    Review of Systems  Constitutional: Negative.   Respiratory: Negative.   Cardiovascular: Negative.   Neurological: Positive for headaches.       Objective:   Physical Exam  Constitutional: She appears well-developed and well-nourished.  HENT:  Right Ear: External ear normal.  Left Ear: External ear normal.  Nose: Nose normal.  Mouth/Throat: Oropharynx is clear and moist.  Eyes: Conjunctivae are normal. Pupils are equal, round, and reactive to light.  Neck: No thyromegaly present.  Cardiovascular: Normal rate, regular rhythm, normal heart sounds and intact distal pulses.   Pulmonary/Chest: Effort normal and breath sounds normal.  Lymphadenopathy:    She has no cervical adenopathy.  Skin:       Clear except for a tiny bite mark behind the left knee. No erythema           Assessment & Plan:  HAs and elevated BPs wheich may be the result of a tick bite. Cover with Doxycycline. Recheck if the BPs are still up next week

## 2012-03-04 ENCOUNTER — Ambulatory Visit (INDEPENDENT_AMBULATORY_CARE_PROVIDER_SITE_OTHER): Payer: BC Managed Care – PPO | Admitting: Internal Medicine

## 2012-03-04 ENCOUNTER — Encounter: Payer: Self-pay | Admitting: Internal Medicine

## 2012-03-04 VITALS — BP 130/84 | HR 73 | Ht 64.0 in | Wt 162.8 lb

## 2012-03-04 DIAGNOSIS — I10 Essential (primary) hypertension: Secondary | ICD-10-CM

## 2012-03-04 DIAGNOSIS — R002 Palpitations: Secondary | ICD-10-CM

## 2012-03-04 MED ORDER — AMLODIPINE BESYLATE 5 MG PO TABS: 5.0000 mg | ORAL_TABLET | Freq: Every day | ORAL | Status: DC

## 2012-03-04 MED ORDER — LOSARTAN POTASSIUM 50 MG PO TABS: 50.0000 mg | ORAL_TABLET | Freq: Every day | ORAL | Status: DC

## 2012-03-04 MED ORDER — LABETALOL HCL 200 MG PO TABS: 200.0000 mg | ORAL_TABLET | Freq: Two times a day (BID) | ORAL | Status: DC

## 2012-03-04 NOTE — Progress Notes (Signed)
HPI Sheri Martinez returns today for followup. She is a very pleasant 56 year old woman with a history of difficult to control hypertension on multiple medications, chest pain not do to obstructive coronary disease, who returns today for followup. She has done well in the interim except for occasional excursions of her blood pressure. She checks her blood pressure daily and notes that for the most part her systolic pressures run in the 161 to 130 range. Under stress, her pressures will increase up to over 160 mmHg. She has had no syncope and denies peripheral edema. She was exercising regularly but has not recently. No recent episodes of chest pain or shortness of breath. Allergies  Allergen Reactions  . Penicillins     dizzy  . Sulfamethoxazole     REACTION: rash     Current Outpatient Prescriptions  Medication Sig Dispense Refill  . amLODipine (NORVASC) 5 MG tablet Take 1 tablet (5 mg total) by mouth daily.  90 tablet  3  . aspirin (ASPIR-81) 81 MG EC tablet Take 81 mg by mouth as needed. For heart per doctor. Per patient      . cetirizine (ZYRTEC) 10 MG tablet Take 10 mg by mouth daily as needed. For allergy      . labetalol (NORMODYNE) 200 MG tablet Take 1 tablet (200 mg total) by mouth 2 (two) times daily.  180 tablet  3  . omeprazole (PRILOSEC) 20 MG capsule Take 20 mg by mouth daily as needed. Upset stomach      . DISCONTD: amLODipine (NORVASC) 5 MG tablet TAKE 1 TABLET DAILY  90 tablet  3  . DISCONTD: labetalol (NORMODYNE) 200 MG tablet Take 1 tablet (200 mg total) by mouth 2 (two) times daily.  60 tablet  1  . losartan (COZAAR) 50 MG tablet Take 1 tablet (50 mg total) by mouth daily.  90 tablet  3     Past Medical History  Diagnosis Date  . Hypertension   . GERD (gastroesophageal reflux disease)   . Hiatal hernia   . Internal hemorrhoids   . Chronic gastritis   . Abdominal pain     right upper quadrant  . Nausea   . Allergic rhinitis   . Hemorrhoid   . Complication of  anesthesia   . PONV (postoperative nausea and vomiting)   . Dysrhythmia     palpitation  . Angina   . Bronchitis 10/12/11    "just got over it'  . Pneumonia 10/12/11    "just got over it"  . Shortness of breath 10/12/11    "at any time; can't relate it to anything; here w/chest tightness; just got over pneumonia/bronchitis"  . Anxiety     ROS:   All systems reviewed and negative except as noted in the HPI.   Past Surgical History  Procedure Date  . Nasal septum surgery 1990's  . Oophorectomy     (tah/bso) (off estrogen 2005)  . Abdominal hysterectomy ~ 1989    TAH; BSO; due to endometriosis  . Cholecystectomy 02/2011  . Dilation and curettage of uterus 1980's     Family History  Problem Relation Age of Onset  . Hypertension Mother   . Heart disease      grandparent  . Ovarian cancer      aunt  . Colon cancer Neg Hx      History   Social History  . Marital Status: Married    Spouse Name: N/A    Number of Children: 2  .  Years of Education: N/A   Occupational History  . [employer]    Social History Main Topics  . Smoking status: Former Smoker -- 0.5 packs/day for 3 years    Types: Cigarettes    Quit date: 12/22/1978  . Smokeless tobacco: Never Used  . Alcohol Use: No  . Drug Use: No  . Sexually Active: Yes   Other Topics Concern  . Not on file   Social History Narrative   Daily caffeine     BP 130/84  Pulse 73  Ht 5\' 4"  (1.626 m)  Wt 162 lb 12.8 oz (73.846 kg)  BMI 27.94 kg/m2  Physical Exam:  Well appearing middle-aged woman, NAD HEENT: Unremarkable Neck:  No JVD, no thyromegally Lungs:  Clear with no wheezes, rales, or rhonchi. HEART:  Regular rate rhythm, no murmurs, no rubs, no clicks Abd:  soft, positive bowel sounds, no organomegally, no rebound, no guarding Ext:  2 plus pulses, no edema, no cyanosis, no clubbing Skin:  No rashes no nodules Neuro:  CN II through XII intact, motor grossly intact  EKG Normal sinus rhythm  with poor R-wave progression.  Assess/Plan:

## 2012-03-04 NOTE — Assessment & Plan Note (Signed)
Her blood pressure has for the most part been controlled. The patient complains about the cost of her micardis for blood pressure. I've recommended switching to losartan. In addition, she is instructed to maintain a low-sodium diet, and to increase her physical activity, and to try and lose some weight.

## 2012-03-04 NOTE — Patient Instructions (Signed)
Your physician wants you to follow-up in: 24 months with Dr Court Joy will receive a reminder letter in the mail two months in advance. If you don't receive a letter, please call our office to schedule the follow-up appointment.  Your physician has recommended you make the following change in your medication:  1) STOP Micardis 2) Start Losartan 50mg  daily

## 2012-03-04 NOTE — Assessment & Plan Note (Signed)
Her symptoms are currently well controlled. She will continue to reduce her caffeine intake. She will continue her beta blocker.

## 2012-03-31 ENCOUNTER — Telehealth: Payer: Self-pay | Admitting: Internal Medicine

## 2012-03-31 MED ORDER — DOXYCYCLINE HYCLATE 100 MG PO TABS: 100.0000 mg | ORAL_TABLET | Freq: Two times a day (BID) | ORAL | Status: AC

## 2012-03-31 NOTE — Telephone Encounter (Signed)
Patient has been bitten by a tick and would like to know if she can have some amoxicillin called into her pharmacy. This is the second tick bite and she states she saw Dr. Clent Ridges and he stated if she knows for sure that she had been bitten she should get abx. Please advise.

## 2012-03-31 NOTE — Telephone Encounter (Signed)
Spot is irritated and red.  She has a h/a, no other symptoms.Dr Clent Ridges seen pt for tick bite last month and according to pt Dr Clent Ridges would give her an antibiotic

## 2012-03-31 NOTE — Addendum Note (Signed)
Addended by: Alfred Levins D on: 03/31/2012 05:11 PM   Modules accepted: Orders

## 2012-03-31 NOTE — Telephone Encounter (Signed)
Call in Doxycycline 100 mg bid for 10 days  

## 2012-03-31 NOTE — Telephone Encounter (Signed)
rx sent in electronically 

## 2012-04-21 ENCOUNTER — Telehealth: Payer: Self-pay | Admitting: Internal Medicine

## 2012-04-21 ENCOUNTER — Emergency Department (HOSPITAL_COMMUNITY)
Admission: EM | Admit: 2012-04-21 | Discharge: 2012-04-22 | Disposition: A | Discharge status: Home / Self Care | Payer: BC Managed Care – PPO | Attending: Emergency Medicine | Admitting: Emergency Medicine

## 2012-04-21 ENCOUNTER — Encounter (HOSPITAL_COMMUNITY): Payer: Self-pay | Admitting: Emergency Medicine

## 2012-04-21 ENCOUNTER — Emergency Department (HOSPITAL_COMMUNITY): Payer: BC Managed Care – PPO

## 2012-04-21 DIAGNOSIS — Z9089 Acquired absence of other organs: Secondary | ICD-10-CM | POA: Insufficient documentation

## 2012-04-21 DIAGNOSIS — K219 Gastro-esophageal reflux disease without esophagitis: Secondary | ICD-10-CM | POA: Insufficient documentation

## 2012-04-21 DIAGNOSIS — R109 Unspecified abdominal pain: Secondary | ICD-10-CM | POA: Insufficient documentation

## 2012-04-21 DIAGNOSIS — F411 Generalized anxiety disorder: Secondary | ICD-10-CM | POA: Insufficient documentation

## 2012-04-21 DIAGNOSIS — I10 Essential (primary) hypertension: Secondary | ICD-10-CM | POA: Insufficient documentation

## 2012-04-21 DIAGNOSIS — Z79899 Other long term (current) drug therapy: Secondary | ICD-10-CM | POA: Insufficient documentation

## 2012-04-21 DIAGNOSIS — Z87891 Personal history of nicotine dependence: Secondary | ICD-10-CM | POA: Insufficient documentation

## 2012-04-21 LAB — URINALYSIS, ROUTINE W REFLEX MICROSCOPIC
Bilirubin Urine: NEGATIVE
Glucose, UA: NEGATIVE mg/dL
Hgb urine dipstick: NEGATIVE
Ketones, ur: NEGATIVE mg/dL
Leukocytes, UA: NEGATIVE
Nitrite: NEGATIVE
Protein, ur: NEGATIVE mg/dL
Specific Gravity, Urine: 1.019 (ref 1.005–1.030)
Urobilinogen, UA: 0.2 mg/dL (ref 0.0–1.0)
pH: 5 (ref 5.0–8.0)

## 2012-04-21 LAB — COMPREHENSIVE METABOLIC PANEL
ALT: 16 U/L (ref 0–35)
AST: 17 U/L (ref 0–37)
Albumin: 4.3 g/dL (ref 3.5–5.2)
Alkaline Phosphatase: 65 U/L (ref 39–117)
BUN: 16 mg/dL (ref 6–23)
CO2: 25 mEq/L (ref 19–32)
Calcium: 9.3 mg/dL (ref 8.4–10.5)
Chloride: 104 mEq/L (ref 96–112)
Creatinine, Ser: 0.78 mg/dL (ref 0.50–1.10)
GFR calc Af Amer: >90 mL/min (ref >90)
GFR calc non Af Amer: >90 mL/min (ref >90)
Glucose, Bld: 100 mg/dL — ABNORMAL HIGH (ref 70–99)
Potassium: 3.8 mEq/L (ref 3.5–5.1)
Sodium: 142 mEq/L (ref 135–145)
Total Bilirubin: 0.3 mg/dL (ref 0.3–1.2)
Total Protein: 7.4 g/dL (ref 6.0–8.3)

## 2012-04-21 LAB — CBC WITH DIFFERENTIAL/PLATELET
Basophils Absolute: 0 10*3/uL (ref 0.0–0.1)
Basophils Relative: 0 % (ref 0–1)
Eosinophils Absolute: 0.1 10*3/uL (ref 0.0–0.7)
Eosinophils Relative: 2 % (ref 0–5)
HCT: 36 % (ref 36.0–46.0)
Hemoglobin: 12.2 g/dL (ref 12.0–15.0)
Lymphocytes Relative: 32 % (ref 12–46)
Lymphs Abs: 1.4 10*3/uL (ref 0.7–4.0)
MCH: 29.8 pg (ref 26.0–34.0)
MCHC: 33.9 g/dL (ref 30.0–36.0)
MCV: 87.8 fL (ref 78.0–100.0)
Monocytes Absolute: 0.3 10*3/uL (ref 0.1–1.0)
Monocytes Relative: 7 % (ref 3–12)
Neutro Abs: 2.7 10*3/uL (ref 1.7–7.7)
Neutrophils Relative %: 58 % (ref 43–77)
Platelets: 171 10*3/uL (ref 150–400)
RBC: 4.1 MIL/uL (ref 3.87–5.11)
RDW: 13 % (ref 11.5–15.5)
WBC: 4.5 10*3/uL (ref 4.0–10.5)

## 2012-04-21 LAB — LIPASE, BLOOD: Lipase: 24 U/L (ref 11–59)

## 2012-04-21 MED ORDER — MORPHINE SULFATE 4 MG/ML IJ SOLN
4.0000 mg | Freq: Once | INTRAMUSCULAR | Status: AC
  Administered 2012-04-21: 4 mg via INTRAVENOUS
  Filled 2012-04-21: qty 1

## 2012-04-21 MED ORDER — OXYCODONE-ACETAMINOPHEN 5-325 MG PO TABS: 1.0000 | ORAL_TABLET | Freq: Four times a day (QID) | ORAL | Status: AC | PRN

## 2012-04-21 MED ORDER — ONDANSETRON HCL 4 MG/2ML IJ SOLN
4.0000 mg | Freq: Once | INTRAMUSCULAR | Status: AC
  Administered 2012-04-21: 4 mg via INTRAVENOUS
  Filled 2012-04-21: qty 2

## 2012-04-21 MED ORDER — SODIUM CHLORIDE 0.9 % IV BOLUS (SEPSIS)
1000.0000 mL | Freq: Once | INTRAVENOUS | Status: AC
  Administered 2012-04-21: 1000 mL via INTRAVENOUS

## 2012-04-21 MED ORDER — ONDANSETRON HCL 4 MG PO TABS: 4.0000 mg | ORAL_TABLET | Freq: Four times a day (QID) | ORAL | Status: AC

## 2012-04-21 MED ORDER — IOHEXOL 300 MG/ML  SOLN
100.0000 mL | Freq: Once | INTRAMUSCULAR | Status: AC | PRN
  Administered 2012-04-21: 100 mL via INTRAVENOUS

## 2012-04-21 MED ORDER — IOHEXOL 300 MG/ML  SOLN
40.0000 mL | Freq: Once | INTRAMUSCULAR | Status: AC | PRN
  Administered 2012-04-21: 40 mL via ORAL

## 2012-04-21 NOTE — ED Provider Notes (Signed)
Medical screening examination/treatment/procedure(s) were conducted as a shared visit with non-physician practitioner(s) and myself.  I personally evaluated the patient during the encounter  I saw this patient primarily  Ethelda Chick, MD 04/21/12 [phone]

## 2012-04-21 NOTE — ED Notes (Signed)
Complaining of nausea which two weeks with pain starting one week ago.  States began gradually and has become progressively worse. Rates pain as 0/10. At worse 8/10. Pressure, full feeling with sensitivity to skin

## 2012-04-21 NOTE — ED Provider Notes (Signed)
History     CSN: [id number]  Arrival date & time 04/21/12  1714   First MD Initiated Contact with Patient 04/21/12 1957      Chief Complaint  Patient presents with  . Abdominal Pain  . Flank Pain    (Consider location/radiation/quality/duration/timing/severity/associated sxs/prior treatment) HPI Pt c/o right flank and right upper abdomen x 2 weeks.  She has had nausea and intermittent bilateral lower abdominal pain.  She states symptoms are worse after eating.  Pain in right abdomen has been worse, however over the last several days she noted RLQ pain- this has now resolved.  She states she feels that her bowels are tightening/squeezing.  Pt has hx of cholecystectomy.  No fever/chills, no vomiting, but has felt nauseated.  Stools have been normal.  There are no other associated systemic symptoms, there are no other alleviating or modifying factors.   Past Medical History  Diagnosis Date  . Hypertension   . GERD (gastroesophageal reflux disease)   . Hiatal hernia   . Internal hemorrhoids   . Chronic gastritis   . Abdominal pain     right upper quadrant  . Nausea   . Allergic rhinitis   . Hemorrhoid   . Complication of anesthesia   . PONV (postoperative nausea and vomiting)   . Dysrhythmia     palpitation  . Angina   . Bronchitis 10/12/11    "just got over it'  . Pneumonia 10/12/11    "just got over it"  . Shortness of breath 10/12/11    "at any time; can't relate it to anything; here w/chest tightness; just got over pneumonia/bronchitis"  . Anxiety     Past Surgical History  Procedure Date  . Nasal septum surgery 1990's  . Oophorectomy     (tah/bso) (off estrogen 2005)  . Abdominal hysterectomy ~ 1989    TAH; BSO; due to endometriosis  . Cholecystectomy 02/2011  . Dilation and curettage of uterus 1980's    Family History  Problem Relation Age of Onset  . Hypertension Mother   . Heart disease      grandparent  . Ovarian cancer      aunt  . Colon cancer Neg  Hx     History  Substance Use Topics  . Smoking status: Former Smoker -- 0.5 packs/day for 3 years    Types: Cigarettes    Quit date: 12/22/1978  . Smokeless tobacco: Never Used  . Alcohol Use: No    OB History    Grav Para Term Preterm Abortions TAB SAB Ect Mult Living                  Review of Systems ROS reviewed and all otherwise negative except for mentioned in HPI  Allergies  Penicillins and Sulfamethoxazole  Home Medications   Current Outpatient Rx  Name Route Sig Dispense Refill  . AMLODIPINE BESYLATE 5 MG PO TABS Oral Take 5 mg by mouth daily.    . ASPIRIN EC 81 MG PO TBEC Oral Take 81 mg by mouth daily.    Marland Kitchen CETIRIZINE HCL 10 MG PO TABS Oral Take 10 mg by mouth daily as needed. For allergy    . LABETALOL HCL 200 MG PO TABS Oral Take 200 mg by mouth 2 (two) times daily.    Marland Kitchen LOSARTAN POTASSIUM 50 MG PO TABS Oral Take 50 mg by mouth daily.    Marland Kitchen OMEPRAZOLE 20 MG PO CPDR Oral Take 20 mg by mouth daily as needed. For  upset stomach    . ONDANSETRON HCL 4 MG PO TABS Oral Take 1 tablet (4 mg total) by mouth every 6 (six) hours. 12 tablet 0  . OXYCODONE-ACETAMINOPHEN 5-325 MG PO TABS Oral Take 1 tablet by mouth every 6 (six) hours as needed for pain. 15 tablet 0    BP 159/79  Pulse 69  Temp 97.6 F (36.4 C) (Oral)  Resp 18  SpO2 96% Vitals reviewed Physical Exam Physical Examination: General appearance - alert, well appearing, and in no distress Mental status - alert, oriented to person, place, and time Eyes - pupils equal and reactive, no scleral icterus, no conjunctival injection Mouth - mucous membranes moist, pharynx normal without lesions Chest - clear to auscultation, no wheezes, rales or rhonchi, symmetric air entry Heart - normal rate, regular rhythm, normal S1, S2, no murmurs, rubs, clicks or gallops Abdomen - soft, nondistended, no masses or organomegaly, nabs, mild ttp over right upper and mid abdomen, no gaurding or rebound tenderness Musculoskeletal  - no joint tenderness, deformity or swelling Extremities - peripheral pulses normal, no pedal edema, no clubbing or cyanosis Skin - normal coloration and turgor, no rashes, no suspicious skin lesions noted Psych- normal mood and affect  ED Course  Procedures (including critical care time)  9:29 PM pt to be moved to CDU while awaiting CT scan.  Her labs and urinalysis are both reassuring.  Pt given IV morphine and zofran for her symptoms.  D/w Marlon Pel, PA.    Labs Reviewed  COMPREHENSIVE METABOLIC PANEL - Abnormal; Notable for the following:    Glucose, Bld 100 (*)     All other components within normal limits  URINALYSIS, ROUTINE W REFLEX MICROSCOPIC  CBC WITH DIFFERENTIAL  LIPASE, BLOOD  LAB REPORT - SCANNED   No results found.   1. Abdominal pain       MDM  Pt with abdominal pain, labs reassuring, abdominal CT did not reveal any acute pathology.  Symptoms treated with IV meds, she was discharged with strict return precautions and agrees to arrange for outpatient followup.         Ethelda Chick, MD 04/24/12 [phone]

## 2012-04-21 NOTE — Telephone Encounter (Signed)
Caller: Sheri Martinez/Patient; PCP: Birdie Sons; CB#: [phone];  Call regarding Chest Pain/Chest Discomfort; Pain present under R ribs at "top of stomach" and radiating to back with ongoing nausea. Onset: 04/18/12.  Afebrile.  Hx cholecystectomy 2011 or 2012.  Intermittent constipation. Advised to call  911 now for pain anywhere in chest lasting 5 min or longer now per Chest Pain Guideline.  Declined to call 911 or go to ED; agreed to go to UC now.

## 2012-04-21 NOTE — ED Notes (Signed)
ORAL CONTRAST GIVEN TO PT. BY CT TECH WITH INSTRUCTIONS , WILL MOVE TO CDU  WHEN READY.

## 2012-04-21 NOTE — ED Provider Notes (Signed)
Pt placed in CDU to wait for CT scan and lab results.  Results for orders placed during the hospital encounter of 04/21/12  URINALYSIS, ROUTINE W REFLEX MICROSCOPIC      Component Value Range   Color, Urine YELLOW  YELLOW   APPearance CLEAR  CLEAR   Specific Gravity, Urine 1.019  1.005 - 1.030   pH 5.0  5.0 - 8.0   Glucose, UA NEGATIVE  NEGATIVE mg/dL   Hgb urine dipstick NEGATIVE  NEGATIVE   Bilirubin Urine NEGATIVE  NEGATIVE   Ketones, ur NEGATIVE  NEGATIVE mg/dL   Protein, ur NEGATIVE  NEGATIVE mg/dL   Urobilinogen, UA 0.2  0.0 - 1.0 mg/dL   Nitrite NEGATIVE  NEGATIVE   Leukocytes, UA NEGATIVE  NEGATIVE  CBC WITH DIFFERENTIAL      Component Value Range   WBC 4.5  4.0 - 10.5 K/uL   RBC 4.10  3.87 - 5.11 MIL/uL   Hemoglobin 12.2  12.0 - 15.0 g/dL   HCT 62.1  30.8 - 65.7 %   MCV 87.8  78.0 - 100.0 fL   MCH 29.8  26.0 - 34.0 pg   MCHC 33.9  30.0 - 36.0 g/dL   RDW 84.6  96.2 - 95.2 %   Platelets 171  150 - 400 K/uL   Neutrophils Relative 58  43 - 77 %   Neutro Abs 2.7  1.7 - 7.7 K/uL   Lymphocytes Relative 32  12 - 46 %   Lymphs Abs 1.4  0.7 - 4.0 K/uL   Monocytes Relative 7  3 - 12 %   Monocytes Absolute 0.3  0.1 - 1.0 K/uL   Eosinophils Relative 2  0 - 5 %   Eosinophils Absolute 0.1  0.0 - 0.7 K/uL   Basophils Relative 0  0 - 1 %   Basophils Absolute 0.0  0.0 - 0.1 K/uL  COMPREHENSIVE METABOLIC PANEL      Component Value Range   Sodium 142  135 - 145 mEq/L   Potassium 3.8  3.5 - 5.1 mEq/L   Chloride 104  96 - 112 mEq/L   CO2 25  19 - 32 mEq/L   Glucose, Bld 100 (*) 70 - 99 mg/dL   BUN 16  6 - 23 mg/dL   Creatinine, Ser 8.41  0.50 - 1.10 mg/dL   Calcium 9.3  8.4 - 32.4 mg/dL   Total Protein 7.4  6.0 - 8.3 g/dL   Albumin 4.3  3.5 - 5.2 g/dL   AST 17  0 - 37 U/L   ALT 16  0 - 35 U/L   Alkaline Phosphatase 65  39 - 117 U/L   Total Bilirubin 0.3  0.3 - 1.2 mg/dL   GFR calc non Af Amer >90  >90 mL/min   GFR calc Af Amer >90  >90 mL/min  LIPASE, BLOOD      Component  Value Range   Lipase 24  11 - 59 U/L   Ct Abdomen Pelvis W Contrast  04/21/2012  *RADIOLOGY REPORT*  Clinical Data: Right lower quadrant pain  CT ABDOMEN AND PELVIS WITH CONTRAST  Technique:  Multidetector CT imaging of the abdomen and pelvis was performed following the standard protocol during bolus administration of intravenous contrast.  Contrast: OMNIPAQUE IOHEXOL 300 MG/ML  SOLN, 40mL OMNIPAQUE IOHEXOL 300 MG/ML  SOLN  Comparison: 04/02/2008  Findings: Post cholecystectomy.  Two small hypodensities in the liver are stable.  Spleen, pancreas, adrenal glands are within normal limits.  Stable  appearance of the kidneys.  Normal appendix.  No free fluid.  No abnormal adenopathy.  IMPRESSION: No acute intra-abdominal pathology.  Original Report Authenticated By: Donavan Burnet, M.D.    All labs and images have come back normal. Pt frustrated that we didn't find anything. I am giving her a referral to GI, a work note, and pain/nausea medication. SHe is not currently having pain and is agreeable to the discharge plan although she is tearful because we didn't find a problem. Re-examined patients abdomen, non tender prior to discharge.  Pt has been advised of the symptoms that warrant their return to the ED. Patient has voiced understanding and has agreed to follow-up with the PCP or specialist.   Dorthula Matas, PA 04/21/12 2300

## 2012-04-21 NOTE — ED Notes (Signed)
Pt c/o right flank and rib area pain x 2 weeks with nausea and intermittent lower abd pain; pt sts worse after eating

## 2012-04-22 NOTE — Telephone Encounter (Signed)
Pt did go to ER. 

## 2012-05-06 ENCOUNTER — Ambulatory Visit (INDEPENDENT_AMBULATORY_CARE_PROVIDER_SITE_OTHER): Payer: BC Managed Care – PPO | Admitting: Family

## 2012-05-06 ENCOUNTER — Encounter: Payer: Self-pay | Admitting: Family

## 2012-05-06 VITALS — BP 142/90 | HR 95 | Temp 97.9°F | Wt 156.0 lb

## 2012-05-06 DIAGNOSIS — A088 Other specified intestinal infections: Secondary | ICD-10-CM

## 2012-05-06 DIAGNOSIS — R110 Nausea: Secondary | ICD-10-CM

## 2012-05-06 DIAGNOSIS — R197 Diarrhea, unspecified: Secondary | ICD-10-CM

## 2012-05-06 DIAGNOSIS — A084 Viral intestinal infection, unspecified: Secondary | ICD-10-CM

## 2012-05-06 MED ORDER — PROMETHAZINE HCL 25 MG PO TABS: 25.0000 mg | ORAL_TABLET | Freq: Three times a day (TID) | ORAL | Status: DC | PRN

## 2012-05-06 NOTE — Patient Instructions (Addendum)
Viral Gastroenteritis Viral gastroenteritis is also known as stomach flu. This condition affects the stomach and intestinal tract. It can cause sudden diarrhea and vomiting. The illness typically lasts 3 to 8 days. Most people develop an immune response that eventually gets rid of the virus. While this natural response develops, the virus can make you quite ill. CAUSES  Many different viruses can cause gastroenteritis, such as rotavirus or noroviruses. You can catch one of these viruses by consuming contaminated food or water. You may also catch a virus by sharing utensils or other personal items with an infected person or by touching a contaminated surface. SYMPTOMS  The most common symptoms are diarrhea and vomiting. These problems can cause a severe loss of body fluids (dehydration) and a body salt (electrolyte) imbalance. Other symptoms may include:  Fever.   Headache.   Fatigue.   Abdominal pain.  DIAGNOSIS  Your caregiver can usually diagnose viral gastroenteritis based on your symptoms and a physical exam. A stool sample may also be taken to test for the presence of viruses or other infections. TREATMENT  This illness typically goes away on its own. Treatments are aimed at rehydration. The most serious cases of viral gastroenteritis involve vomiting so severely that you are not able to keep fluids down. In these cases, fluids must be given through an intravenous line (IV). HOME CARE INSTRUCTIONS   Drink enough fluids to keep your urine clear or pale yellow. Drink small amounts of fluids frequently and increase the amounts as tolerated.   Ask your caregiver for specific rehydration instructions.   Avoid:   Foods high in sugar.   Alcohol.   Carbonated drinks.   Tobacco.   Juice.   Caffeine drinks.   Extremely hot or cold fluids.   Fatty, greasy foods.   Too much intake of anything at one time.   Dairy products until 24 to 48 hours after diarrhea stops.   You may  consume probiotics. Probiotics are active cultures of beneficial bacteria. They may lessen the amount and number of diarrheal stools in adults. Probiotics can be found in yogurt with active cultures and in supplements.   Wash your hands well to avoid spreading the virus.   Only take over-the-counter or prescription medicines for pain, discomfort, or fever as directed by your caregiver. Do not give aspirin to children. Antidiarrheal medicines are not recommended.   Ask your caregiver if you should continue to take your regular prescribed and over-the-counter medicines.   Keep all follow-up appointments as directed by your caregiver.  SEEK IMMEDIATE MEDICAL CARE IF:   You are unable to keep fluids down.   You do not urinate at least once every 6 to 8 hours.   You develop shortness of breath.   You notice blood in your stool or vomit. This may look like coffee grounds.   You have abdominal pain that increases or is concentrated in one small area (localized).   You have persistent vomiting or diarrhea.   You have a fever.   The patient is a child younger than 3 months, and he or she has a fever.   The patient is a child older than 3 months, and he or she has a fever and persistent symptoms.   The patient is a child older than 3 months, and he or she has a fever and symptoms suddenly get worse.   The patient is a baby, and he or she has no tears when crying.  MAKE SURE YOU:     Understand these instructions.   Will watch your condition.   Will get help right away if you are not doing well or get worse.  Document Released: 10/01/2005 Document Revised: 09/20/2011 Document Reviewed: 07/18/2011 [employer] Patient Information 2012 [employer]. 

## 2012-05-06 NOTE — Progress Notes (Signed)
Subjective:    Patient ID: Sheri Martinez, female    DOB: 03-06-1956, 56 y.o.   MRN: [id number]  HPI 56 year old in with complaints of nausea, vomiting, diarrhea x 1 day with no improvement. She denies any new foods or beverages. Denies any blood in her stool, dark black stools. She has mild abdominal pain, described as cramping that is relieved with defecation.    Review of Systems  Constitutional: Positive for fever, chills and fatigue.  HENT: Negative.   Respiratory: Negative.   Cardiovascular: Negative.   Gastrointestinal: Positive for nausea, vomiting, abdominal pain and diarrhea.  Musculoskeletal: Negative.   Skin: Negative.   Neurological: Negative.   Hematological: Negative.   Psychiatric/Behavioral: Negative.    Past Medical History  Diagnosis Date  . Hypertension   . GERD (gastroesophageal reflux disease)   . Hiatal hernia   . Internal hemorrhoids   . Chronic gastritis   . Abdominal pain     right upper quadrant  . Nausea   . Allergic rhinitis   . Hemorrhoid   . Complication of anesthesia   . PONV (postoperative nausea and vomiting)   . Dysrhythmia     palpitation  . Angina   . Bronchitis 10/12/11    "just got over it'  . Pneumonia 10/12/11    "just got over it"  . Shortness of breath 10/12/11    "at any time; can't relate it to anything; here w/chest tightness; just got over pneumonia/bronchitis"  . Anxiety     History   Social History  . Marital Status: Married    Spouse Name: N/A    Number of Children: 2  . Years of Education: N/A   Occupational History  . [employer]    Social History Main Topics  . Smoking status: Former Smoker -- 0.5 packs/day for 3 years    Types: Cigarettes    Quit date: 12/22/1978  . Smokeless tobacco: Never Used  . Alcohol Use: No  . Drug Use: No  . Sexually Active: Yes   Other Topics Concern  . Not on file   Social History Narrative   Daily caffeine    Past Surgical History  Procedure Date  . Nasal  septum surgery 1990's  . Oophorectomy     (tah/bso) (off estrogen 2005)  . Abdominal hysterectomy ~ 1989    TAH; BSO; due to endometriosis  . Cholecystectomy 02/2011  . Dilation and curettage of uterus 1980's    Family History  Problem Relation Age of Onset  . Hypertension Mother   . Heart disease      grandparent  . Ovarian cancer      aunt  . Colon cancer Neg Hx     Allergies  Allergen Reactions  . Penicillins     dizzy  . Sulfamethoxazole     REACTION: rash    Current Outpatient Prescriptions on File Prior to Visit  Medication Sig Dispense Refill  . amLODipine (NORVASC) 5 MG tablet Take 5 mg by mouth daily.      Marland Kitchen aspirin EC 81 MG tablet Take 81 mg by mouth daily.      . cetirizine (ZYRTEC) 10 MG tablet Take 10 mg by mouth daily as needed. For allergy      . labetalol (NORMODYNE) 200 MG tablet Take 200 mg by mouth 2 (two) times daily.      Marland Kitchen losartan (COZAAR) 50 MG tablet Take 50 mg by mouth daily.      Marland Kitchen omeprazole (PRILOSEC) 20  MG capsule Take 20 mg by mouth daily as needed. For upset stomach      . promethazine (PHENERGAN) 25 MG tablet Take 1 tablet (25 mg total) by mouth every 8 (eight) hours as needed for nausea.  20 tablet  0    BP 142/90  Pulse 95  Temp 97.9 F (36.6 C) (Oral)  Wt 156 lb (70.761 kg)  SpO2 98%chart    Objective:   Physical Exam  Constitutional: She is oriented to person, place, and time. She appears well-developed and well-nourished.  Cardiovascular: Normal rate, regular rhythm and normal heart sounds.   Pulmonary/Chest: Effort normal and breath sounds normal.  Abdominal: Soft. Bowel sounds are normal.  Neurological: She is alert and oriented to person, place, and time.  Skin: Skin is warm and dry.  Psychiatric: She has a normal mood and affect.          Assessment & Plan:  Assessment:viral gastroenteritis, nausea, vomiting, diarrhea  Plan: Over-the-counter symptomatic treatment for relief. Phenergan 12.5 mg one half to one  tablet every 8 hours when necessary nausea. Rest. Drink plenty of fluids. Gatorade. Patient call the office if symptoms worsen or persist. Check a schedule, when necessary.

## 2012-05-09 ENCOUNTER — Ambulatory Visit (INDEPENDENT_AMBULATORY_CARE_PROVIDER_SITE_OTHER): Payer: BC Managed Care – PPO | Admitting: Internal Medicine

## 2012-05-09 ENCOUNTER — Encounter: Payer: Self-pay | Admitting: Internal Medicine

## 2012-05-09 ENCOUNTER — Telehealth: Payer: Self-pay | Admitting: Internal Medicine

## 2012-05-09 VITALS — BP 138/90 | HR 87 | Temp 98.1°F | Wt 155.0 lb

## 2012-05-09 DIAGNOSIS — A09 Infectious gastroenteritis and colitis, unspecified: Secondary | ICD-10-CM

## 2012-05-09 DIAGNOSIS — E860 Dehydration: Secondary | ICD-10-CM

## 2012-05-09 DIAGNOSIS — I10 Essential (primary) hypertension: Secondary | ICD-10-CM | POA: Insufficient documentation

## 2012-05-09 DIAGNOSIS — I959 Hypotension, unspecified: Secondary | ICD-10-CM

## 2012-05-09 HISTORY — DX: Infectious gastroenteritis and colitis, unspecified: A09

## 2012-05-09 HISTORY — DX: Dehydration: E86.0

## 2012-05-09 LAB — POCT URINALYSIS DIP (MANUAL ENTRY)
Bilirubin, UA: NEGATIVE
Blood, UA: NEGATIVE
Glucose, UA: NEGATIVE
Ketones, POC UA: NEGATIVE
Leukocytes, UA: NEGATIVE
Nitrite, UA: NEGATIVE
Protein Ur, POC: NEGATIVE
Spec Grav, UA: 1.01
Urobilinogen, UA: 0.2
pH, UA: 5.5

## 2012-05-09 LAB — HEPATIC FUNCTION PANEL
ALT: 20 U/L (ref 0–35)
AST: 19 U/L (ref 0–37)
Albumin: 4.6 g/dL (ref 3.5–5.2)
Alkaline Phosphatase: 52 U/L (ref 39–117)
Bilirubin, Direct: 0 mg/dL (ref 0.0–0.3)
Total Bilirubin: 0.5 mg/dL (ref 0.3–1.2)
Total Protein: 7.6 g/dL (ref 6.0–8.3)

## 2012-05-09 LAB — CBC WITH DIFFERENTIAL/PLATELET
Basophils Absolute: 0 10*3/uL (ref 0.0–0.1)
Basophils Relative: 0.3 % (ref 0.0–3.0)
Eosinophils Absolute: 0.1 10*3/uL (ref 0.0–0.7)
Eosinophils Relative: 1.9 % (ref 0.0–5.0)
HCT: 37.8 % (ref 36.0–46.0)
Hemoglobin: 12.6 g/dL (ref 12.0–15.0)
Lymphocytes Relative: 22.5 % (ref 12.0–46.0)
Lymphs Abs: 0.9 10*3/uL (ref 0.7–4.0)
MCHC: 33.4 g/dL (ref 30.0–36.0)
MCV: 89.6 fl (ref 78.0–100.0)
Monocytes Absolute: 0.3 10*3/uL (ref 0.1–1.0)
Monocytes Relative: 8.3 % (ref 3.0–12.0)
Neutro Abs: 2.7 10*3/uL (ref 1.4–7.7)
Neutrophils Relative %: 67 % (ref 43.0–77.0)
Platelets: 195 10*3/uL (ref 150.0–400.0)
RBC: 4.22 Mil/uL (ref 3.87–5.11)
RDW: 13.9 % (ref 11.5–14.6)
WBC: 4 10*3/uL — ABNORMAL LOW (ref 4.5–10.5)

## 2012-05-09 LAB — BASIC METABOLIC PANEL
BUN: 27 mg/dL — ABNORMAL HIGH (ref 6–23)
CO2: 28 mEq/L (ref 19–32)
Calcium: 9.1 mg/dL (ref 8.4–10.5)
Chloride: 103 mEq/L (ref 96–112)
Creatinine, Ser: 0.8 mg/dL (ref 0.4–1.2)
GFR: 79.44 mL/min (ref >60.00)
Glucose, Bld: 96 mg/dL (ref 70–99)
Potassium: 4 mEq/L (ref 3.5–5.1)
Sodium: 140 mEq/L (ref 135–145)

## 2012-05-09 LAB — SEDIMENTATION RATE: Sed Rate: 27 mm/hr — ABNORMAL HIGH (ref 0–22)

## 2012-05-09 NOTE — Patient Instructions (Signed)
You have infectious gastroenteritis. Although your blood pressure is normal in the office if you have concerned about low blood pressure at home I would like you to stop the amlodipine and losartan for couple days and still your blood pressure and to your blood pressure comes back up  Increased hydration fluids Gatorade Pedialyte non-fruit juices. Light foods. You do not need to be working at this time. Stool sample and blood work today If your blood pressure remains low and you feel that you're getting worse and dehydrated seek care through the emergency room to get IV fluids.   You can try Imodium to try to slow the bowel down but that will not cure the situation just decreased symptoms in the short run.

## 2012-05-09 NOTE — Progress Notes (Signed)
Subjective:    Patient ID: Sheri Martinez, female    DOB: 1956-07-28, 56 y.o.   MRN: [id number]  HPI Patient comes in today for SDA for  new problem evaluation. see call from [facility]. Onset 4 days ago of diarrhea and vomiting onset acute without fever UTi sx blood in stool.no unusual travel water and no one sick at home . Keeping down gator age  Sprite and some solids but felt worse the past 2 days  .has ht and [facility] and her home bp readings were 90/60 reported but no syncope new ha . Feels weak some.   Over the lst 24 hours still a bit more formed some cramps but no sever abd pain. Hx of antibiotic in the past few months ;cant remember which one.  Saw [facility] earlier but since  Here cause feeling worse and  Concern about BP.  On 3 meds for bp   Outpatient Encounter Prescriptions as of 05/09/2012  Medication Sig Dispense Refill  . amLODipine (NORVASC) 5 MG tablet Take 5 mg by mouth daily.      Marland Kitchen aspirin EC 81 MG tablet Take 81 mg by mouth daily.      . cetirizine (ZYRTEC) 10 MG tablet Take 10 mg by mouth daily as needed. For allergy      . labetalol (NORMODYNE) 200 MG tablet Take 200 mg by mouth 2 (two) times daily.      Marland Kitchen losartan (COZAAR) 50 MG tablet Take 50 mg by mouth daily.      Marland Kitchen omeprazole (PRILOSEC) 20 MG capsule Take 20 mg by mouth daily as needed. For upset stomach  only takes ocass     . promethazine (PHENERGAN) 25 MG tablet Take 1 tablet (25 mg total) by mouth every 8 (eight) hours as needed for nausea.  20 tablet  0    Past history family history social history reviewed in the electronic medical record. No sig etoh  hhof 4 2 d cat not sick  Review of Systems ROS:  GEN/ HEENT: No fever, significant weight changes sweats headaches vision problems hearing changes, CV/ PULM; No chest pain shortness of breath cough, syncope,edema  change in exercise tolerance. GI /GU: as per hpi  No blood in the stool. No significant GU symptoms. SKIN/HEME: ,no acute skin rashes suspicious lesions or  bleeding. No lymphadenopathy, nodules, masses.  NEURO/ PSYCH:  No neurologic signs such as weakness numbness. No depression anxiety. IMM/ Allergy: No unusual infections.   REST of 12 system review negative except as per HPI     Objective:   Physical Exam BP 138/90  Pulse 87  Temp 98.1 F (36.7 C) (Oral)  Wt 155 lb (70.308 kg)  SpO2 97% Wt Readings from Last 3 Encounters:  05/09/12 155 lb (70.308 kg)  05/06/12 156 lb (70.761 kg)  03/04/12 162 lb 12.8 oz (73.846 kg)   WDWN in nad stressed somewhat washed out  Oriented x 3 and no noted deficits in memory, attention, and speech. Orthostatic bp 128/80     Sit stand 130/78  Pulse 88 Skin: normal capillary refill ,turgor , color: No acute rashes ,petechiae or bruising Neck: Supple without adenopathy or masses or bruits Chest:  Clear to A&P without wheezes rales or rhonchi CV:  S1-S2 no gallops or murmurs peripheral perfusion is normal Abdomen:  Sof,t normal bowel sounds increase  without hepatosplenomegaly, no guarding rebound or masses no CVA tenderness Neuro non focal     Assessment & Plan:   acute G E .  HT on 3 meds and report of low bp at home   Fortunate that bp good in office  And can [facility] response Disc    Expectant management. Still seems viral but  Has hx of antibiotic at some point.    Hold amlodipine and cozaar for now until bp better  Disc hydration and to seek emergent care for alarm features . Lab and stool tests today . Pending and fu depending on how she is doing and results   Total visit > 50% spent counseling and coordinating care

## 2012-05-09 NOTE — Telephone Encounter (Signed)
Caller: Rhylin/Patient; PCP: Birdie Sons; CB#: [phone];  Call regarding Low B/P, Diarrhea, Nausea; Onset N/V, Diarrhea 7/21.  Seen in office this week and given phenergan.  Vomiting has subsided but continues to have frequent watery stools brought on every time she drinks or eats.  Afebrile.  States normal bp for her with her BP med's 120's/90's.  BP has been 90's/60's for the last 2 days.  Continues to take med's.  Mouth is moist, urinating but less.  Feels weaker than she has all week.  Able to stand and walk.  Drove to work this am but came back home due to feeling so bad.  Per Diarrhea or Other Change in Bowel Habits Protocol all emergent symptoms ruled out with exception of "Diarrhea lasting longer than 24 hours and not improving with home care."  Care advice given.  Disposition to see in 24 hrs.  No appts today per Epic, will send note to office and see if there is a work in today.

## 2012-05-09 NOTE — Telephone Encounter (Signed)
I called pt, offered OV with Adline Mango, who she saw on 7/23, or Dr Fabian Sharp also has an opening at 11:15.  Pt requested soonest available appt.  Scheduled with Dr Fabian Sharp 11:15, Misty informed.

## 2012-05-11 LAB — FECAL LACTOFERRIN, QUANT: Lactoferrin: NEGATIVE

## 2012-05-12 LAB — GIARDIA/CRYPTOSPORIDIUM (EIA)
Cryptosporidium Screen (EIA): NEGATIVE
Giardia Screen (EIA): NEGATIVE

## 2012-05-14 LAB — CLOSTRIDIUM DIFFICILE BY PCR: Toxigenic C. Difficile by PCR: NOT DETECTED

## 2012-05-14 LAB — STOOL CULTURE

## 2013-01-12 ENCOUNTER — Other Ambulatory Visit: Payer: Self-pay | Admitting: Internal Medicine

## 2013-02-06 ENCOUNTER — Other Ambulatory Visit: Payer: Self-pay | Admitting: Internal Medicine

## 2013-02-07 ENCOUNTER — Encounter (HOSPITAL_COMMUNITY): Payer: Self-pay | Admitting: *Deleted

## 2013-02-07 DIAGNOSIS — Z8701 Personal history of pneumonia (recurrent): Secondary | ICD-10-CM | POA: Insufficient documentation

## 2013-02-07 DIAGNOSIS — R112 Nausea with vomiting, unspecified: Secondary | ICD-10-CM | POA: Insufficient documentation

## 2013-02-07 DIAGNOSIS — K219 Gastro-esophageal reflux disease without esophagitis: Secondary | ICD-10-CM | POA: Insufficient documentation

## 2013-02-07 DIAGNOSIS — R1031 Right lower quadrant pain: Secondary | ICD-10-CM | POA: Insufficient documentation

## 2013-02-07 DIAGNOSIS — Z8719 Personal history of other diseases of the digestive system: Secondary | ICD-10-CM | POA: Insufficient documentation

## 2013-02-07 DIAGNOSIS — Z87891 Personal history of nicotine dependence: Secondary | ICD-10-CM | POA: Insufficient documentation

## 2013-02-07 DIAGNOSIS — Z7982 Long term (current) use of aspirin: Secondary | ICD-10-CM | POA: Insufficient documentation

## 2013-02-07 DIAGNOSIS — Z8679 Personal history of other diseases of the circulatory system: Secondary | ICD-10-CM | POA: Insufficient documentation

## 2013-02-07 DIAGNOSIS — R197 Diarrhea, unspecified: Secondary | ICD-10-CM | POA: Insufficient documentation

## 2013-02-07 DIAGNOSIS — F411 Generalized anxiety disorder: Secondary | ICD-10-CM | POA: Insufficient documentation

## 2013-02-07 DIAGNOSIS — Z8709 Personal history of other diseases of the respiratory system: Secondary | ICD-10-CM | POA: Insufficient documentation

## 2013-02-07 DIAGNOSIS — Z79899 Other long term (current) drug therapy: Secondary | ICD-10-CM | POA: Insufficient documentation

## 2013-02-07 DIAGNOSIS — I10 Essential (primary) hypertension: Secondary | ICD-10-CM | POA: Insufficient documentation

## 2013-02-07 LAB — CBC WITH DIFFERENTIAL/PLATELET
Basophils Absolute: 0 10*3/uL (ref 0.0–0.1)
Basophils Relative: 0 % (ref 0–1)
Eosinophils Absolute: 0.1 10*3/uL (ref 0.0–0.7)
Eosinophils Relative: 1 % (ref 0–5)
HCT: 37.6 % (ref 36.0–46.0)
Hemoglobin: 13 g/dL (ref 12.0–15.0)
Lymphocytes Relative: 7 % — ABNORMAL LOW (ref 12–46)
Lymphs Abs: 0.5 10*3/uL — ABNORMAL LOW (ref 0.7–4.0)
MCH: 29.7 pg (ref 26.0–34.0)
MCHC: 34.6 g/dL (ref 30.0–36.0)
MCV: 86 fL (ref 78.0–100.0)
Monocytes Absolute: 0.3 10*3/uL (ref 0.1–1.0)
Monocytes Relative: 5 % (ref 3–12)
Neutro Abs: 5.7 10*3/uL (ref 1.7–7.7)
Neutrophils Relative %: 87 % — ABNORMAL HIGH (ref 43–77)
Platelets: 180 10*3/uL (ref 150–400)
RBC: 4.37 MIL/uL (ref 3.87–5.11)
RDW: 12.8 % (ref 11.5–15.5)
WBC: 6.6 10*3/uL (ref 4.0–10.5)

## 2013-02-07 LAB — URINALYSIS, ROUTINE W REFLEX MICROSCOPIC
Glucose, UA: NEGATIVE mg/dL
Hgb urine dipstick: NEGATIVE
Ketones, ur: 15 mg/dL — AB
Nitrite: NEGATIVE
Protein, ur: NEGATIVE mg/dL
Specific Gravity, Urine: 1.028 (ref 1.005–1.030)
Urobilinogen, UA: 0.2 mg/dL (ref 0.0–1.0)
pH: 5 (ref 5.0–8.0)

## 2013-02-07 LAB — COMPREHENSIVE METABOLIC PANEL
ALT: 17 U/L (ref 0–35)
AST: 19 U/L (ref 0–37)
Albumin: 4.7 g/dL (ref 3.5–5.2)
Alkaline Phosphatase: 61 U/L (ref 39–117)
BUN: 23 mg/dL (ref 6–23)
CO2: 26 mEq/L (ref 19–32)
Calcium: 9.5 mg/dL (ref 8.4–10.5)
Chloride: 105 mEq/L (ref 96–112)
Creatinine, Ser: 0.88 mg/dL (ref 0.50–1.10)
GFR calc Af Amer: 82 mL/min — ABNORMAL LOW (ref >90)
GFR calc non Af Amer: 71 mL/min — ABNORMAL LOW (ref >90)
Glucose, Bld: 118 mg/dL — ABNORMAL HIGH (ref 70–99)
Potassium: 3.7 mEq/L (ref 3.5–5.1)
Sodium: 142 mEq/L (ref 135–145)
Total Bilirubin: 0.6 mg/dL (ref 0.3–1.2)
Total Protein: 7.7 g/dL (ref 6.0–8.3)

## 2013-02-07 LAB — URINE MICROSCOPIC-ADD ON

## 2013-02-07 NOTE — ED Notes (Signed)
Rt. Lower abd. Pain x 1 week, then bladder started hurting. Urgency. No hx. Of kidney stones. Bad diarrhea episode. Throwing up all day. Abd. Tight. No back pain.

## 2013-02-08 ENCOUNTER — Emergency Department (HOSPITAL_COMMUNITY)
Admission: EM | Admit: 2013-02-08 | Discharge: 2013-02-08 | Disposition: A | Discharge status: Home / Self Care | Payer: BC Managed Care – PPO | Attending: Emergency Medicine | Admitting: Emergency Medicine

## 2013-02-08 ENCOUNTER — Emergency Department (HOSPITAL_COMMUNITY): Payer: BC Managed Care – PPO

## 2013-02-08 DIAGNOSIS — R197 Diarrhea, unspecified: Secondary | ICD-10-CM

## 2013-02-08 DIAGNOSIS — R112 Nausea with vomiting, unspecified: Secondary | ICD-10-CM

## 2013-02-08 DIAGNOSIS — R1031 Right lower quadrant pain: Secondary | ICD-10-CM

## 2013-02-08 MED ORDER — ONDANSETRON HCL 4 MG/2ML IJ SOLN
4.0000 mg | Freq: Once | INTRAMUSCULAR | Status: AC
  Administered 2013-02-08: 4 mg via INTRAVENOUS
  Filled 2013-02-08: qty 2

## 2013-02-08 MED ORDER — ONDANSETRON 4 MG PO TBDP: 4.0000 mg | ORAL_TABLET | Freq: Four times a day (QID) | ORAL | Status: DC | PRN

## 2013-02-08 MED ORDER — DEXTROSE 5 % IV SOLN
2.0000 g | Freq: Once | INTRAVENOUS | Status: AC
  Administered 2013-02-08: 2 g via INTRAVENOUS
  Filled 2013-02-08: qty 2

## 2013-02-08 MED ORDER — HYDROCODONE-ACETAMINOPHEN 5-325 MG PO TABS: 1.0000 | ORAL_TABLET | Freq: Four times a day (QID) | ORAL | Status: DC | PRN

## 2013-02-08 MED ORDER — IBUPROFEN 600 MG PO TABS: 600.0000 mg | ORAL_TABLET | Freq: Four times a day (QID) | ORAL | Status: DC | PRN

## 2013-02-08 MED ORDER — HYDROMORPHONE HCL PF 1 MG/ML IJ SOLN
0.5000 mg | INTRAMUSCULAR | Status: AC
  Administered 2013-02-08: 0.5 mg via INTRAVENOUS
  Filled 2013-02-08: qty 1

## 2013-02-08 MED ORDER — IOHEXOL 300 MG/ML  SOLN
20.0000 mL | INTRAMUSCULAR | Status: AC
  Administered 2013-02-08: 50 mL via ORAL

## 2013-02-08 MED ORDER — IOHEXOL 300 MG/ML  SOLN
100.0000 mL | Freq: Once | INTRAMUSCULAR | Status: AC | PRN
  Administered 2013-02-08: 100 mL via INTRAVENOUS

## 2013-02-08 MED ORDER — NITROFURANTOIN MONOHYD MACRO 100 MG PO CAPS: 100.0000 mg | ORAL_CAPSULE | Freq: Two times a day (BID) | ORAL | Status: DC

## 2013-02-08 NOTE — ED Provider Notes (Signed)
History     CSN: [id number]  Arrival date & time 02/07/13  2234   First MD Initiated Contact with Patient 02/08/13 0151      No chief complaint on file.   (Consider location/radiation/quality/duration/timing/severity/associated sxs/prior treatment) HPI Sheri Martinez is a 57 y.o. female presents to the ER with abdominal pain in the right lower quadrant. Patient's pain started last weekend when she was at the beach picking up some of her son's items from college, at that time was periumbilical. Throughout the week the pain has remained constant in the periumbilical region and over the last day or 2 has migrated to the right lower quadrant and today was associated with nausea and vomiting x2. Patient had one large diarrhea type bowel movements, prior to that she had been constipated this week. Patient also endorses worsening anorexia over the course of the week, today she tried to eat some crackers and Coke at 1400 but vomited that up, that was lasting she said to eat or drink all day.  Patient is had a cholecystectomy, no dysuria, no frequency, no chest pain, shortness of breath, rash, myalgias, sore throat, congestion, headaches.    Past Medical History  Diagnosis Date  . Hypertension   . GERD (gastroesophageal reflux disease)   . Hiatal hernia   . Internal hemorrhoids   . Chronic gastritis   . Abdominal pain     right upper quadrant  . Nausea   . Allergic rhinitis   . Hemorrhoid   . Complication of anesthesia   . PONV (postoperative nausea and vomiting)   . Dysrhythmia     palpitation  . Angina   . Bronchitis 10/12/11    "just got over it'  . Pneumonia 10/12/11    "just got over it"  . Shortness of breath 10/12/11    "at any time; can't relate it to anything; here w/chest tightness; just got over pneumonia/bronchitis"  . Anxiety     Past Surgical History  Procedure Laterality Date  . Nasal septum surgery  1990's  . Oophorectomy      (tah/bso) (off estrogen 2005)  .  Abdominal hysterectomy  ~ 1989    TAH; BSO; due to endometriosis  . Cholecystectomy  02/2011  . Dilation and curettage of uterus  1980's    Family History  Problem Relation Age of Onset  . Hypertension Mother   . Heart disease      grandparent  . Ovarian cancer      aunt  . Colon cancer Neg Hx     History  Substance Use Topics  . Smoking status: Former Smoker -- 0.50 packs/day for 3 years    Types: Cigarettes    Quit date: 12/22/1978  . Smokeless tobacco: Never Used  . Alcohol Use: No    OB History   Grav Para Term Preterm Abortions TAB SAB Ect Mult Living                  Review of Systems At least 10pt or greater review of systems completed and are negative except where specified in the HPI.  Allergies  Penicillins and Sulfamethoxazole  Home Medications   Current Outpatient Rx  Name  Route  Sig  Dispense  Refill  . amLODipine (NORVASC) 5 MG tablet   Oral   Take 5 mg by mouth daily.         Marland Kitchen aspirin EC 81 MG tablet   Oral   Take 81 mg by mouth daily.         Marland Kitchen  fexofenadine (ALLEGRA) 60 MG tablet   Oral   Take 60 mg by mouth daily.         Marland Kitchen labetalol (NORMODYNE) 200 MG tablet   Oral   Take 200 mg by mouth 2 (two) times daily.         Marland Kitchen losartan (COZAAR) 50 MG tablet   Oral   Take 50 mg by mouth daily.         Marland Kitchen omeprazole (PRILOSEC) 20 MG capsule   Oral   Take 20 mg by mouth daily as needed (for acid reflux). For upset stomach           BP 126/68  Pulse 85  Temp(Src) 98.9 F (37.2 C) (Oral)  Resp 18  SpO2 97%  Physical Exam  Nursing notes reviewed.  Electronic medical record reviewed. VITAL SIGNS:   Filed Vitals:   02/07/13 2244 02/08/13 0211  BP: 167/82 126/68  Pulse: 96 85  Temp: 98.2 F (36.8 C) 98.9 F (37.2 C)  TempSrc: Oral Oral  Resp: 16 18  SpO2: 99% 97%   CONSTITUTIONAL: Awake, orientedx4, appears non-toxic HENT: Atraumatic, normocephalic, oral mucosa pink and moist, airway patent. Nares patent without  drainage. External ears normal. EYES: Conjunctiva clear, EOMI, PERRLA NECK: Trachea midline, non-tender, supple CARDIOVASCULAR: Normal heart rate, Normal rhythm, No murmurs, rubs, gallops PULMONARY/CHEST: Clear to auscultation, no rhonchi, wheezes, or rales. Symmetrical breath sounds. Non-tender. ABDOMINAL: Non-distended, soft,tenderness to palpation at McBurney's point.  BS increased.  Negative psoas, obturator and Rovsing's. NEUROLOGIC: Non-focal, moving all four extremities, no gross sensory or motor deficits. EXTREMITIES: No clubbing, cyanosis, or edema SKIN: Warm, Dry, No erythema, No rash  ED Course  Procedures (including critical care time)  Labs Reviewed  URINALYSIS, ROUTINE W REFLEX MICROSCOPIC - Abnormal; Notable for the following:    APPearance TURBID (*)    Bilirubin Urine SMALL (*)    Ketones, ur 15 (*)    Leukocytes, UA TRACE (*)    All other components within normal limits  CBC WITH DIFFERENTIAL - Abnormal; Notable for the following:    Neutrophils Relative 87 (*)    Lymphocytes Relative 7 (*)    Lymphs Abs 0.5 (*)    All other components within normal limits  COMPREHENSIVE METABOLIC PANEL - Abnormal; Notable for the following:    Glucose, Bld 118 (*)    GFR calc non Af Amer 71 (*)    GFR calc Af Amer 82 (*)    All other components within normal limits  URINE MICROSCOPIC-ADD ON - Abnormal; Notable for the following:    Squamous Epithelial / LPF FEW (*)    Bacteria, UA FEW (*)    All other components within normal limits   Ct Abdomen Pelvis W Contrast  02/08/2013  *RADIOLOGY REPORT*  Clinical Data: Lower abdominal pain for 1 week.  Diarrhea. Vomiting.  CT ABDOMEN AND PELVIS WITH CONTRAST  Technique:  Multidetector CT imaging of the abdomen and pelvis was performed following the standard protocol during bolus administration of intravenous contrast.  Contrast: OMNIPAQUE IOHEXOL 300 MG/ML  SOLN  Comparison: 04/21/2012  Findings: Minimal dependent changes in the  lung bases.  Sub centimeter low attenuation lesions in the liver appears stable since previous study and likely represents cyst or small hemangiomas.  No focal solid nodules demonstrated.  Surgical absence of the gallbladder.  The spleen, pancreas, adrenal glands, kidneys, inferior vena cava, retroperitoneal lymph nodes, stomach, small bowel, and colon are unremarkable.  No free air or free fluid  in the abdomen.  Calcification of the aorta without aneurysm.  Pelvis:  Bladder wall is not thickened.  No free or loculated pelvic fluid collections.  The appendix is normal.  No diverticulitis.  Uterus appears surgically absent.  No abnormal adnexal masses.  Normal alignment of the lumbar vertebrae.  IMPRESSION: No acute process demonstrated in the abdomen or pelvis.  The appendix is normal.   Original Report Authenticated By: Burman Nieves, M.D.      1. Right lower quadrant pain   2. Nausea and vomiting   3. Diarrhea       MDM   Patient gives a good clinical history for acute appendicitis. White count is within normal limits. Obtain CT abdomen pelvis.  CT of abdomen and pelvis demonstrates the appendix well, discussed this with radiology, there is no acute appendicitis at this time.  Patient did have some gastroenteritis, this is not demonstrated on CT, she is concern for urinary tract infection-will treat for UTI with her concerns although her urinalysis is not suggestive of it.  She is given precautions for early appendicitis and told to return to the ER for any worsening pain in the right lower quadrant, fevers, protracted vomiting, or any other concerning symptoms. I have answered her questions. Patient is stable for discharge.       Jones Skene, MD 02/10/13 [phone]

## 2013-02-08 NOTE — ED Notes (Signed)
Patient transported to CT 

## 2013-05-04 DIAGNOSIS — Z9889 Other specified postprocedural states: Secondary | ICD-10-CM

## 2013-05-04 HISTORY — DX: Other specified postprocedural states: Z98.890

## 2013-05-16 IMAGING — CT CT ABD-PELV W/ CM
2 of 5 series · 17 of 46 positions shown, 19 images · IV contrast (APPLIED)
Comparison: 04/21/2012

CLINICAL DATA: Lower abdominal pain for 1 week.  Diarrhea.
Vomiting.

CT ABDOMEN AND PELVIS WITH CONTRAST
TECHNIQUE: Multidetector CT imaging of the abdomen and pelvis was
performed following the standard protocol during bolus
administration of intravenous contrast.
Contrast: 100mL OMNIPAQUE IOHEXOL 300 MG/ML  SOLN

[Series 2: abd/pelv with 5.0 b31f st · axial · 0.74mm/px · z∈[+690,+1115]mm · 14 of 96 slices shown, 16 images]
[im 6/96  soft-tissue]
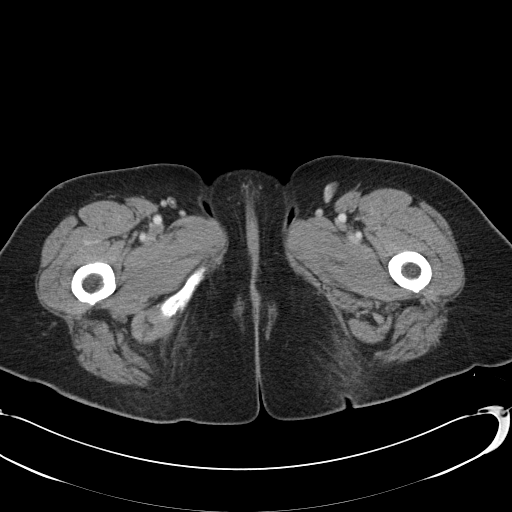
[im 6/96  bone]
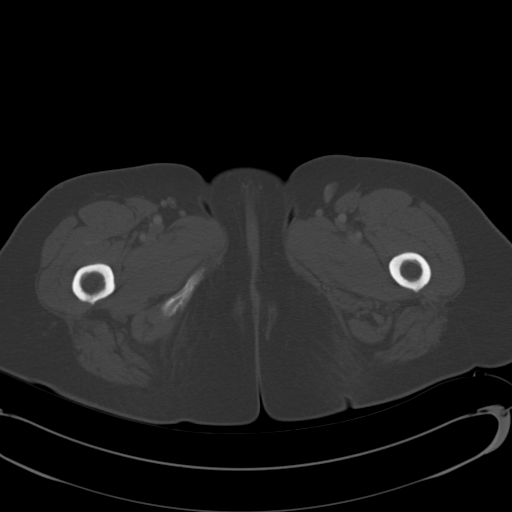
[im 11/96  soft-tissue]
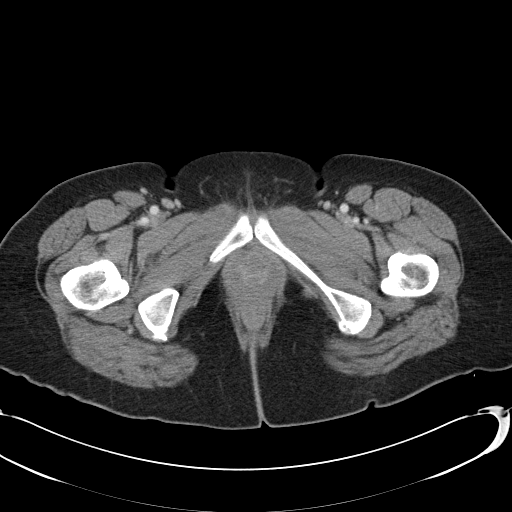
[im 21/96  soft-tissue]
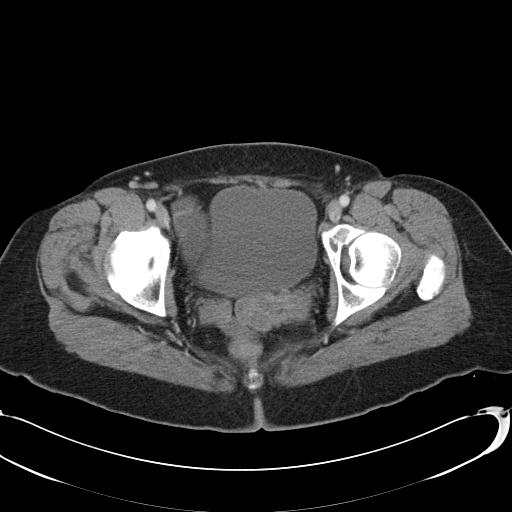
[im 26/96  soft-tissue]
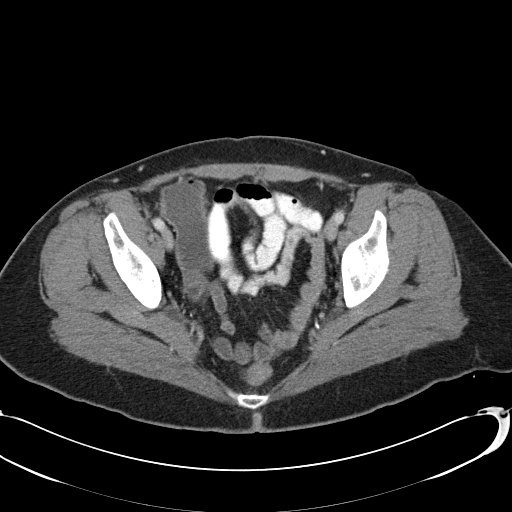
[im 31/96  soft-tissue]
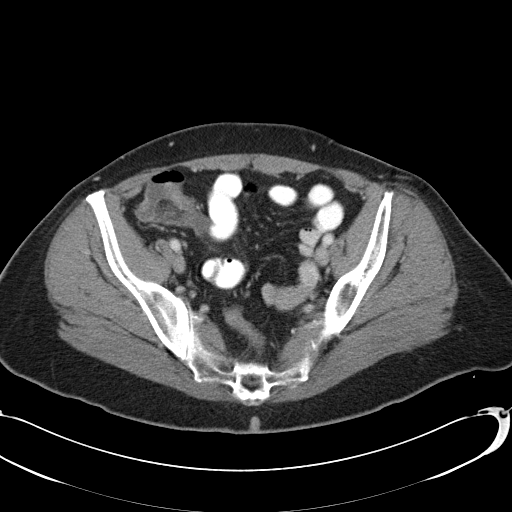
[im 41/96  soft-tissue]
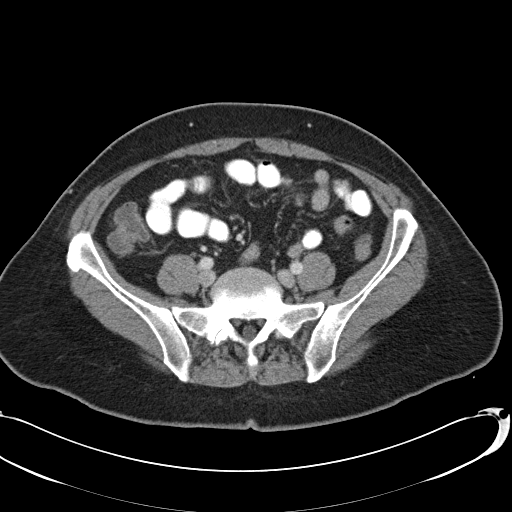
[im 46/96  soft-tissue]
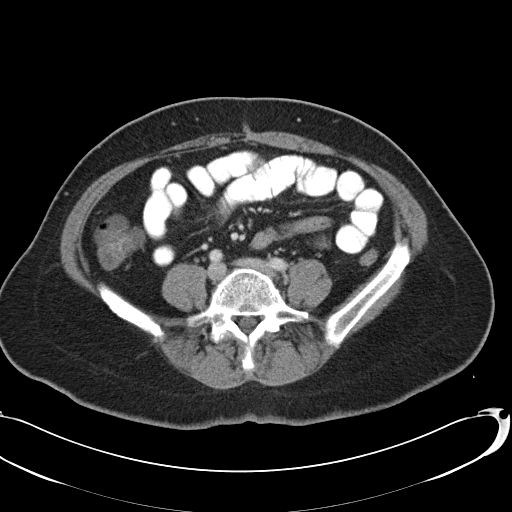
[im 51/96  soft-tissue]
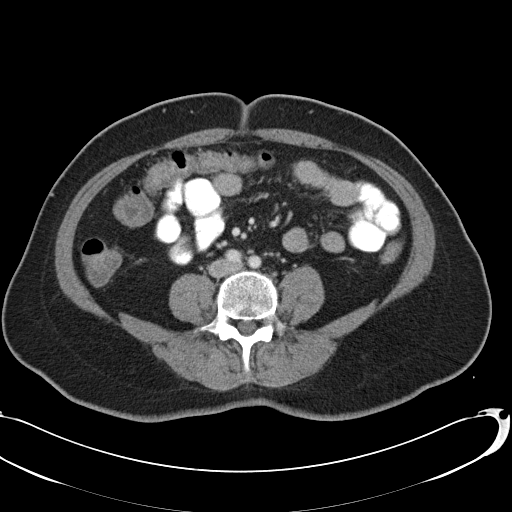
[im 56/96  soft-tissue]
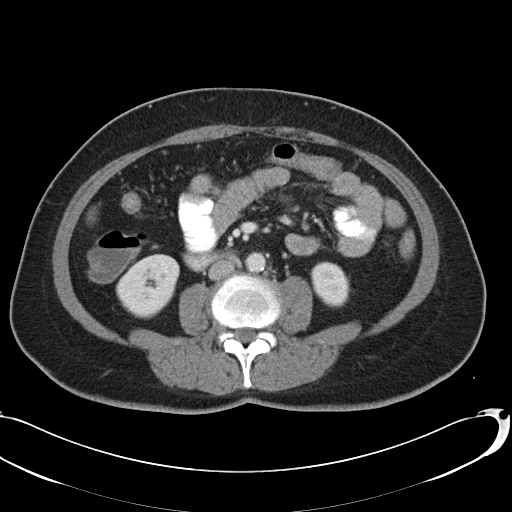
[im 56/96  bone]
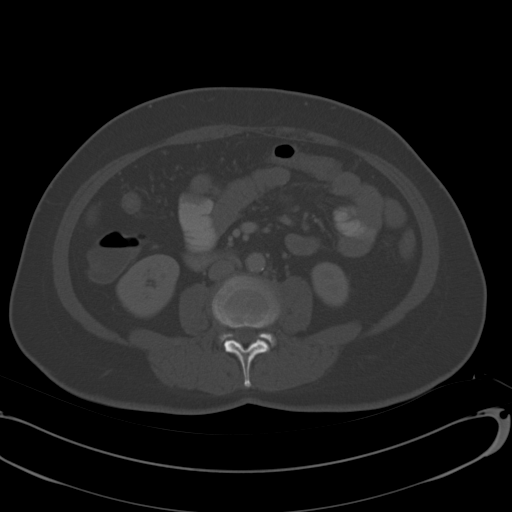
[im 66/96  soft-tissue]
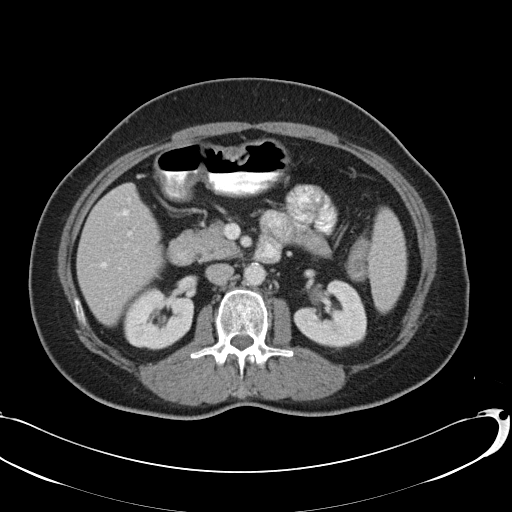
[im 71/96  soft-tissue]
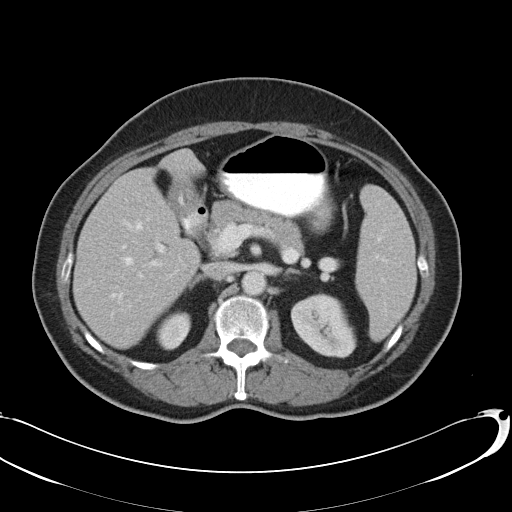
[im 76/96  soft-tissue]
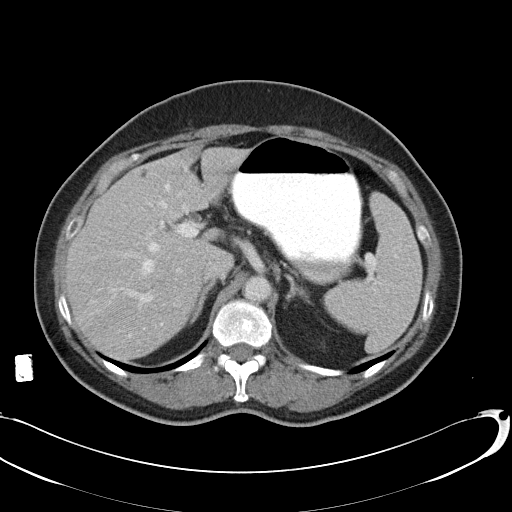
[im 86/96  soft-tissue]
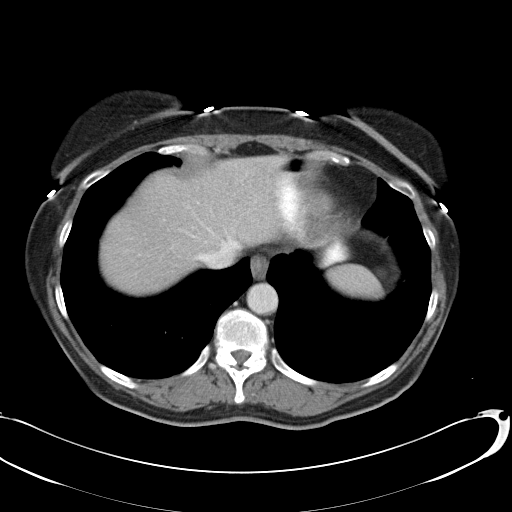
[im 91/96  soft-tissue]
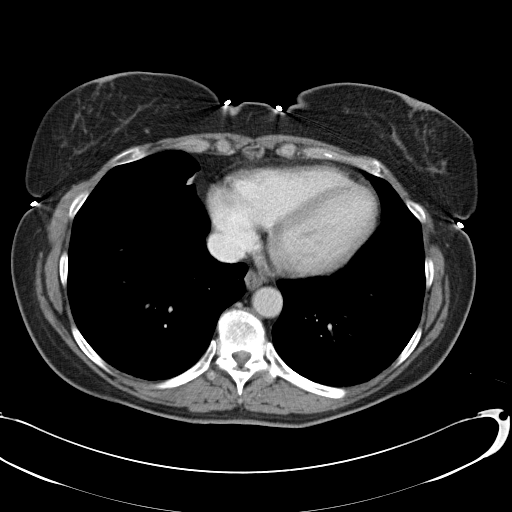

[Series 602: cor · coronal · 0.93mm/px · 3 of 114 slices shown]
[im 38/114  soft-tissue]
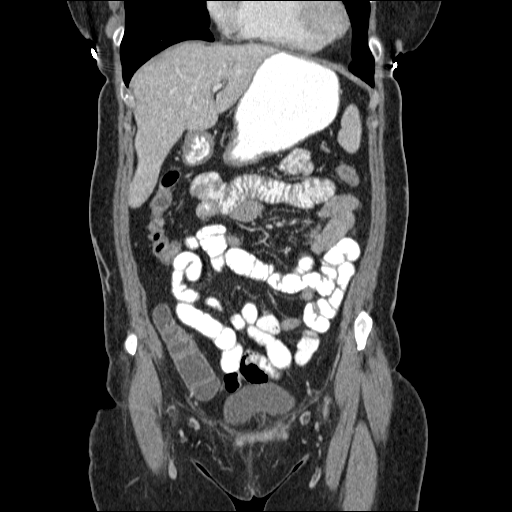
[im 51/114  soft-tissue]
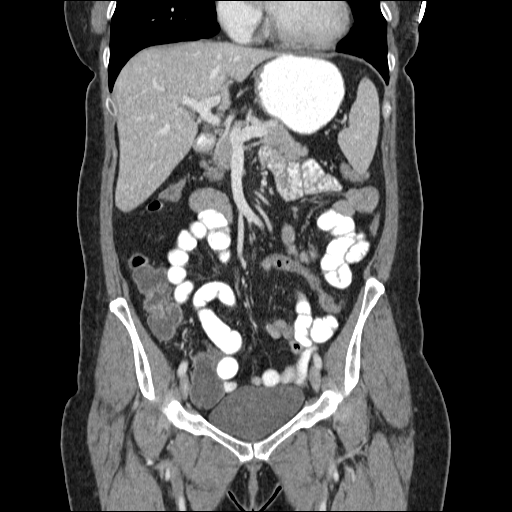
[im 63/114  soft-tissue]
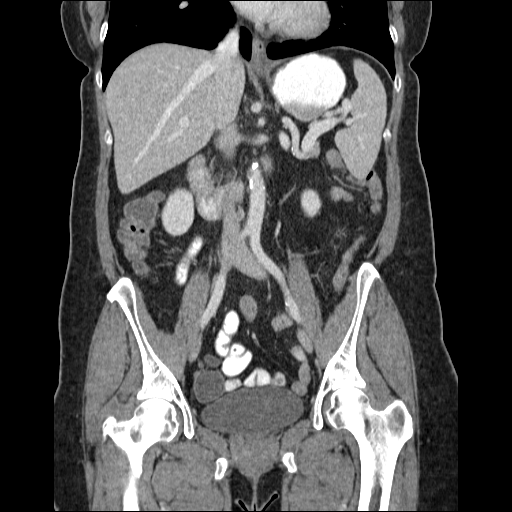

[17 of 46 positions shown; findings below may reference images not displayed]

FINDINGS: Minimal dependent changes in the lung bases.

Sub centimeter low attenuation lesions in the liver appears stable
since previous study and likely represents cyst or small
hemangiomas.  No focal solid nodules demonstrated.  Surgical
absence of the gallbladder.  The spleen, pancreas, adrenal glands,
kidneys, inferior vena cava, retroperitoneal lymph nodes, stomach,
small bowel, and colon are unremarkable.  No free air or free fluid
in the abdomen.  Calcification of the aorta without aneurysm.

Pelvis:  Bladder wall is not thickened.  No free or loculated
pelvic fluid collections.  The appendix is normal.  No
diverticulitis.  Uterus appears surgically absent.  No abnormal
adnexal masses.  Normal alignment of the lumbar vertebrae.
IMPRESSION: No acute process demonstrated in the abdomen or pelvis.  The
appendix is normal.

## 2013-06-19 ENCOUNTER — Other Ambulatory Visit: Payer: Self-pay | Admitting: Internal Medicine

## 2013-09-17 ENCOUNTER — Other Ambulatory Visit: Payer: Self-pay | Admitting: Internal Medicine

## 2013-10-07 ENCOUNTER — Other Ambulatory Visit: Payer: Self-pay | Admitting: Internal Medicine

## 2013-10-28 ENCOUNTER — Other Ambulatory Visit: Payer: Self-pay | Admitting: Internal Medicine

## 2013-10-29 ENCOUNTER — Other Ambulatory Visit: Payer: Self-pay

## 2013-10-29 MED ORDER — LOSARTAN POTASSIUM 50 MG PO TABS: 50.0000 mg | ORAL_TABLET | Freq: Every day | ORAL | Status: DC

## 2013-10-29 MED ORDER — LABETALOL HCL 200 MG PO TABS: 200.0000 mg | ORAL_TABLET | Freq: Two times a day (BID) | ORAL | Status: DC

## 2013-11-23 ENCOUNTER — Other Ambulatory Visit: Payer: Self-pay | Admitting: Internal Medicine

## 2014-01-14 ENCOUNTER — Other Ambulatory Visit: Payer: Self-pay | Admitting: Internal Medicine

## 2014-02-16 ENCOUNTER — Ambulatory Visit: Payer: BC Managed Care – PPO | Admitting: Internal Medicine

## 2014-02-21 ENCOUNTER — Encounter (HOSPITAL_COMMUNITY): Payer: Self-pay | Admitting: Emergency Medicine

## 2014-02-21 ENCOUNTER — Emergency Department (HOSPITAL_COMMUNITY)
Admission: EM | Admit: 2014-02-21 | Discharge: 2014-02-21 | Disposition: A | Discharge status: Home / Self Care | Payer: BC Managed Care – PPO | Attending: Emergency Medicine | Admitting: Emergency Medicine

## 2014-02-21 ENCOUNTER — Other Ambulatory Visit: Payer: Self-pay | Admitting: Internal Medicine

## 2014-02-21 DIAGNOSIS — Z79899 Other long term (current) drug therapy: Secondary | ICD-10-CM | POA: Insufficient documentation

## 2014-02-21 DIAGNOSIS — Z792 Long term (current) use of antibiotics: Secondary | ICD-10-CM | POA: Insufficient documentation

## 2014-02-21 DIAGNOSIS — Z8701 Personal history of pneumonia (recurrent): Secondary | ICD-10-CM | POA: Insufficient documentation

## 2014-02-21 DIAGNOSIS — R040 Epistaxis: Secondary | ICD-10-CM | POA: Insufficient documentation

## 2014-02-21 DIAGNOSIS — Z8709 Personal history of other diseases of the respiratory system: Secondary | ICD-10-CM | POA: Insufficient documentation

## 2014-02-21 DIAGNOSIS — Z7982 Long term (current) use of aspirin: Secondary | ICD-10-CM | POA: Insufficient documentation

## 2014-02-21 DIAGNOSIS — Z8659 Personal history of other mental and behavioral disorders: Secondary | ICD-10-CM | POA: Insufficient documentation

## 2014-02-21 DIAGNOSIS — Z880 Allergy status to penicillin: Secondary | ICD-10-CM | POA: Insufficient documentation

## 2014-02-21 DIAGNOSIS — I10 Essential (primary) hypertension: Secondary | ICD-10-CM | POA: Insufficient documentation

## 2014-02-21 DIAGNOSIS — Z87891 Personal history of nicotine dependence: Secondary | ICD-10-CM | POA: Insufficient documentation

## 2014-02-21 DIAGNOSIS — K219 Gastro-esophageal reflux disease without esophagitis: Secondary | ICD-10-CM | POA: Insufficient documentation

## 2014-02-21 MED ORDER — OXYMETAZOLINE HCL 0.05 % NA SOLN
2.0000 | Freq: Once | NASAL | Status: AC
  Administered 2014-02-21: 2 via NASAL
  Filled 2014-02-21: qty 15

## 2014-02-21 NOTE — ED Notes (Signed)
Woke up this am with Rt. Nares nose bleed.  Was at the [facility] last Thursday and had her rt.nares caulderzed due to a nose bleed.    Pt. Reports having a headache.  Pt,. Is in NAD  No active bleeding noted presently.  Pt. Is holding pressure

## 2014-02-21 NOTE — ED Provider Notes (Signed)
CSN: [id number]     Arrival date & time 02/21/14  0929 History   First MD Initiated Contact with Patient 02/21/14 [phone]     Chief Complaint  Patient presents with  . Epistaxis     (Consider location/radiation/quality/duration/timing/severity/associated sxs/prior Treatment) Patient is a 58 y.o. female presenting with nosebleeds. The history is provided by the patient and the spouse.  Epistaxis   She complains of right-sided nosebleed that started today. She's had intermittent nose daily for 4 days after a nasal endoscopy seizure 2 evaluate for sinusitis. That procedure was apparently negative. She is also had CT scan of the head that did not show sinusitis. She denies fever, chills, nausea, vomiting, weakness, dizziness, chest pain or shortness of breath. No other known modifying factors.  Past Medical History  Diagnosis Date  . Hypertension   . GERD (gastroesophageal reflux disease)   . Hiatal hernia   . Internal hemorrhoids   . Chronic gastritis   . Abdominal pain     right upper quadrant  . Nausea   . Allergic rhinitis   . Hemorrhoid   . Complication of anesthesia   . PONV (postoperative nausea and vomiting)   . Dysrhythmia     palpitation  . Angina   . Bronchitis 10/12/11    "just got over it'  . Pneumonia 10/12/11    "just got over it"  . Shortness of breath 10/12/11    "at any time; can't relate it to anything; here w/chest tightness; just got over pneumonia/bronchitis"  . Anxiety   . Palpitations    Past Surgical History  Procedure Laterality Date  . Nasal septum surgery  1990's  . Oophorectomy      (tah/bso) (off estrogen 2005)  . Abdominal hysterectomy  ~ 1989    TAH; BSO; due to endometriosis  . Cholecystectomy  02/2011  . Dilation and curettage of uterus  1980's   Family History  Problem Relation Age of Onset  . Hypertension Mother   . Heart disease      grandparent  . Ovarian cancer      aunt  . Colon cancer Neg Hx    History  Substance Use  Topics  . Smoking status: Former Smoker -- 0.50 packs/day for 3 years    Types: Cigarettes    Quit date: 12/22/1978  . Smokeless tobacco: Never Used  . Alcohol Use: No   OB History   Grav Para Term Preterm Abortions TAB SAB Ect Mult Living                 Review of Systems  HENT: Positive for nosebleeds.   All other systems reviewed and are negative.     Allergies  Penicillins and Sulfamethoxazole  Home Medications   Prior to Admission medications   Medication Sig Start Date End Date Taking? Authorizing Provider  amLODipine (NORVASC) 5 MG tablet Take 5 mg by mouth daily.   Yes Historical Provider, MD  amoxicillin (AMOXIL) 875 MG tablet Take 875 mg by mouth 2 (two) times daily. Started 02/18/14, for 10 days.   Yes Historical Provider, MD  aspirin EC 81 MG tablet Take 81 mg by mouth daily.   Yes Historical Provider, MD  fexofenadine (ALLEGRA) 60 MG tablet Take 60 mg by mouth daily as needed for allergies.    Yes Historical Provider, MD  labetalol (NORMODYNE) 200 MG tablet Take 200 mg by mouth 2 (two) times daily.   Yes Historical Provider, MD  losartan (COZAAR) 50 MG tablet Take  50 mg by mouth daily.   Yes Historical Provider, MD  omeprazole (PRILOSEC) 20 MG capsule Take 20 mg by mouth daily as needed (for acid reflux). For upset stomach   Yes Historical Provider, MD   BP 178/97  Pulse 76  Resp 20  SpO2 98% Physical Exam  Nursing note and vitals reviewed. Constitutional: She is oriented to person, place, and time. She appears well-developed and well-nourished.  HENT:  Head: Normocephalic and atraumatic.  Large blood clot, right anterior nares without active bleeding. No bleeding, left nares  Eyes: Conjunctivae and EOM are normal. Pupils are equal, round, and reactive to light.  Neck: Normal range of motion and phonation normal. Neck supple.  Cardiovascular: Normal rate, regular rhythm and intact distal pulses.   Pulmonary/Chest: Effort normal and breath sounds normal. She  exhibits no tenderness.  Musculoskeletal: Normal range of motion.  Neurological: She is alert and oriented to person, place, and time. She exhibits normal muscle tone.  Skin: Skin is warm and dry.  Psychiatric: She has a normal mood and affect. Her behavior is normal. Judgment and thought content normal.    ED Course  Procedures (including critical care time)  Medications  oxymetazoline (AFRIN) 0.05 % nasal spray 2 spray (not administered)    Patient Vitals for the past 24 hrs:  BP Pulse Resp SpO2  02/21/14 1300 178/97 mmHg 76 - 98 %  02/21/14 1245 168/75 mmHg 67 - 99 %  02/21/14 1230 177/66 mmHg 77 - 99 %  02/21/14 1215 180/79 mmHg 78 - 99 %  02/21/14 1200 167/78 mmHg 73 - 99 %  02/21/14 1145 164/89 mmHg 75 - 100 %  02/21/14 1130 159/75 mmHg 70 - 99 %  02/21/14 1115 157/82 mmHg 73 - 98 %  02/21/14 1100 126/65 mmHg 53 - 100 %  02/21/14 1030 195/93 mmHg 79 - 100 %  02/21/14 1000 177/86 mmHg 83 - 100 %  02/21/14 0945 177/94 mmHg 72 - 100 %  02/21/14 0938 184/80 mmHg 71 20 100 %   Epistaxis, evaluation and treatment- initially clots removed from right nares with a sibling, and extraction using bayonet forceps. Afrin was instilled. No is evident excoriation and slow oozing from the right anterior venous plexus region. There is no arterial bleeding. She was treated with silver nitrate, direct application. Is observed for one hour. The bleeding recurred, and a small area posterior to the area initially treated. This was treated then again observed for one hour in the bleeding resolved.  1:51 PM Reevaluation with update and discussion. After initial assessment and treatment, an updated evaluation reveals there is nasal congestion in thehe right nares, but no bleeding. Findings discussed the patient, questions answered. Richarda Blade     MDM   Final diagnoses:  Right-sided nosebleed    Epistaxis, likely secondary to abrasion. No hemodynamic instability, active infection or  impending vascular collapse.   Nursing Notes Reviewed/ Care Coordinated Applicable Imaging Reviewed Interpretation of Laboratory Data incorporated into ED treatment  The patient appears reasonably screened and/or stabilized for discharge and I doubt any other medical condition or other [facility] requiring further screening, evaluation, or treatment in the ED at this time prior to discharge.  Plan: Home Medications- usual, resume nasal hygine in 48 hours; Home Treatments- rest; return here if the recommended treatment, does not improve the symptoms; Recommended follow up- PCP and ENT prn    Richarda Blade, MD 02/21/14 1355

## 2014-02-21 NOTE — Discharge Instructions (Signed)
Do not put anything in the nose for 48 hours. Return here if nasal pressure, does not stop nosebleeding, within one hour.   Nosebleed A nosebleed can be caused by many things, including:  Getting hit hard in the nose.  Infections.  Dry nose.  Colds.  Medicines. Your doctor may do lab testing if you get nosebleeds a lot and the cause is not known. HOME CARE   If your nose was packed with material, keep it there until your doctor takes it out. Put the pack back in your nose if the pack falls out.  Do not blow your nose for 12 hours after the nosebleed.  Sit up and bend forward if your nose starts bleeding again. Pinch the front half of your nose nonstop for 20 minutes.  Put petroleum jelly inside your nose every morning if you have a dry nose.  Use a humidifier to make the air less dry.  Do not take aspirin.  Try not to strain, lift, or bend at the waist for many days after the nosebleed. GET HELP RIGHT AWAY IF:   Nosebleeds keep happening and are hard to stop or control.  You have bleeding or bruises that are not normal on other parts of the body.  You have a fever.  The nosebleeds get worse.  You get lightheaded, feel faint, sweaty, or throw up (vomit) blood. MAKE SURE YOU:   Understand these instructions.  Will watch your condition.  Will get help right away if you are not doing well or get worse. Document Released: 07/10/2008 Document Revised: 12/24/2011 Document Reviewed: 07/10/2008 [facility] Patient Information 2014 [employer].

## 2014-02-21 NOTE — ED Notes (Signed)
Pt.s rt. Nares began to bleed very slowly , reported to Dr. Eulis Foster

## 2014-03-04 ENCOUNTER — Other Ambulatory Visit: Payer: Self-pay

## 2014-03-09 ENCOUNTER — Encounter: Payer: Self-pay | Admitting: Internal Medicine

## 2014-03-09 ENCOUNTER — Ambulatory Visit (INDEPENDENT_AMBULATORY_CARE_PROVIDER_SITE_OTHER): Payer: BC Managed Care – PPO | Admitting: Internal Medicine

## 2014-03-09 VITALS — BP 167/84 | HR 70 | Ht 64.0 in | Wt 159.0 lb

## 2014-03-09 DIAGNOSIS — I10 Essential (primary) hypertension: Secondary | ICD-10-CM

## 2014-03-09 DIAGNOSIS — R002 Palpitations: Secondary | ICD-10-CM

## 2014-03-09 MED ORDER — LOSARTAN POTASSIUM 50 MG PO TABS: 50.0000 mg | ORAL_TABLET | Freq: Every day | ORAL | Status: DC

## 2014-03-09 MED ORDER — AMLODIPINE BESYLATE 5 MG PO TABS: 5.0000 mg | ORAL_TABLET | Freq: Every day | ORAL | Status: DC

## 2014-03-09 MED ORDER — LABETALOL HCL 200 MG PO TABS: 200.0000 mg | ORAL_TABLET | Freq: Two times a day (BID) | ORAL | Status: DC

## 2014-03-09 NOTE — Assessment & Plan Note (Signed)
Her symptoms are fairly well controlled. No change in meds. I have encouraged the patient to reduce her caffeine intake.

## 2014-03-09 NOTE — Patient Instructions (Signed)
Your physician wants you to follow-up in: 24 months with Dr. Taylor You will receive a reminder letter in the mail two months in advance. If you don't receive a letter, please call our office to schedule the follow-up appointment.  

## 2014-03-09 NOTE — Progress Notes (Signed)
HPI Sheri Martinez returns today for followup. She is a pleasant 58 yo woman with a h/o HTN and palpitations and chest pain with no obstructive CAD by cath years ago. She has not been to our clinic in 2 years. She does note some increased stress at home from her daughter who is graduating this year. She has had net change in her weight. When I saw her last she last 10 lbs but gained much of it back. She notes palpitations which increase in frequency and severity when she is stressed or when she lays down to sleep. At home her blood pressure is fairly well controlled.   Allergies  Allergen Reactions  . Penicillins     Dizzy. Can tolerate Amoxicillin.  . Sulfamethoxazole     REACTION: rash     Current Outpatient Prescriptions  Medication Sig Dispense Refill  . amLODipine (NORVASC) 5 MG tablet TAKE 1 TABLET DAILY  90 tablet  0  . aspirin EC 81 MG tablet Take 81 mg by mouth daily.      . carisoprodol (SOMA) 350 MG tablet Take 1 tablet by mouth daily.      Marland Kitchen DYMISTA 137-50 MCG/ACT SUSP Place 1 spray into the nose as needed.      . fexofenadine (ALLEGRA) 60 MG tablet Take 60 mg by mouth daily as needed for allergies.       Marland Kitchen labetalol (NORMODYNE) 200 MG tablet Take 200 mg by mouth 2 (two) times daily.      Marland Kitchen losartan (COZAAR) 50 MG tablet Take 50 mg by mouth daily.      . meloxicam (MOBIC) 15 MG tablet Take 1 tablet by mouth daily.      . mupirocin ointment (BACTROBAN) 2 % Apply 1 application topically as needed.      Marland Kitchen omeprazole (PRILOSEC) 20 MG capsule Take 20 mg by mouth daily as needed (for acid reflux). For upset stomach       No current facility-administered medications for this visit.     Past Medical History  Diagnosis Date  . Hypertension   . GERD (gastroesophageal reflux disease)   . Hiatal hernia   . Internal hemorrhoids   . Chronic gastritis   . Abdominal pain     right upper quadrant  . Nausea   . Allergic rhinitis   . Hemorrhoid   . Complication of anesthesia     . PONV (postoperative nausea and vomiting)   . Dysrhythmia     palpitation  . Angina   . Bronchitis 10/12/11    "just got over it'  . Pneumonia 10/12/11    "just got over it"  . Shortness of breath 10/12/11    "at any time; can't relate it to anything; here w/chest tightness; just got over pneumonia/bronchitis"  . Anxiety   . Palpitations     ROS:   All systems reviewed and negative except as noted in the HPI.   Past Surgical History  Procedure Laterality Date  . Nasal septum surgery  1990's  . Oophorectomy      (tah/bso) (off estrogen 2005)  . Abdominal hysterectomy  ~ 1989    TAH; BSO; due to endometriosis  . Cholecystectomy  02/2011  . Dilation and curettage of uterus  1980's     Family History  Problem Relation Age of Onset  . Hypertension Mother   . Heart disease      grandparent  . Ovarian cancer      aunt  .  Colon cancer Neg Hx      History   Social History  . Marital Status: Married    Spouse Name: N/A    Number of Children: 2  . Years of Education: N/A   Occupational History  . [employer]    Social History Main Topics  . Smoking status: Former Smoker -- 0.50 packs/day for 3 years    Types: Cigarettes    Quit date: 12/22/1978  . Smokeless tobacco: Never Used  . Alcohol Use: No  . Drug Use: No  . Sexual Activity: Yes   Other Topics Concern  . Not on file   Social History Narrative   Daily caffeine     BP 167/84  Pulse 70  Ht 5\' 4"  (1.626 m)  Wt 159 lb (72.122 kg)  BMI 27.28 kg/m2  Physical Exam:  Well appearing 58 yo woman, NAD HEENT: Unremarkable Neck:  6 cm JVD, no thyromegally Back:  No CVA tenderness Lungs:  Clear with no wheezes HEART:  Regular rate rhythm, no murmurs, no rubs, no clicks Abd:  soft, positive bowel sounds, no organomegally, no rebound, no guarding Ext:  2 plus pulses, no edema, no cyanosis, no clubbing Skin:  No rashes no nodules Neuro:  CN II through XII intact, motor grossly intact  EKG -  nsr   Assess/Plan:

## 2014-03-09 NOTE — Assessment & Plan Note (Signed)
Her blood pressure is high today but she states that at home it has been under 140 and usually lower. Will follow.

## 2014-08-17 ENCOUNTER — Ambulatory Visit: Payer: Self-pay | Admitting: Ophthalmology

## 2015-03-07 ENCOUNTER — Other Ambulatory Visit: Payer: Self-pay | Admitting: Internal Medicine

## 2015-03-08 NOTE — Telephone Encounter (Signed)
Per note 5.26.15

## 2015-04-22 ENCOUNTER — Other Ambulatory Visit: Payer: Self-pay | Admitting: Internal Medicine

## 2015-11-11 ENCOUNTER — Encounter (HOSPITAL_COMMUNITY): Payer: Self-pay | Admitting: Vascular Surgery

## 2015-11-11 ENCOUNTER — Emergency Department (HOSPITAL_COMMUNITY)
Admission: EM | Admit: 2015-11-11 | Discharge: 2015-11-11 | Disposition: A | Discharge status: Home / Self Care | Payer: BLUE CROSS/BLUE SHIELD | Attending: Emergency Medicine | Admitting: Emergency Medicine

## 2015-11-11 DIAGNOSIS — I10 Essential (primary) hypertension: Secondary | ICD-10-CM | POA: Diagnosis not present

## 2015-11-11 DIAGNOSIS — Z880 Allergy status to penicillin: Secondary | ICD-10-CM | POA: Diagnosis not present

## 2015-11-11 DIAGNOSIS — Z79899 Other long term (current) drug therapy: Secondary | ICD-10-CM | POA: Insufficient documentation

## 2015-11-11 DIAGNOSIS — Y9389 Activity, other specified: Secondary | ICD-10-CM | POA: Insufficient documentation

## 2015-11-11 DIAGNOSIS — Z8659 Personal history of other mental and behavioral disorders: Secondary | ICD-10-CM | POA: Diagnosis not present

## 2015-11-11 DIAGNOSIS — Z8709 Personal history of other diseases of the respiratory system: Secondary | ICD-10-CM | POA: Diagnosis not present

## 2015-11-11 DIAGNOSIS — Z041 Encounter for examination and observation following transport accident: Secondary | ICD-10-CM | POA: Insufficient documentation

## 2015-11-11 DIAGNOSIS — I209 Angina pectoris, unspecified: Secondary | ICD-10-CM | POA: Diagnosis not present

## 2015-11-11 DIAGNOSIS — Z7982 Long term (current) use of aspirin: Secondary | ICD-10-CM | POA: Insufficient documentation

## 2015-11-11 DIAGNOSIS — K219 Gastro-esophageal reflux disease without esophagitis: Secondary | ICD-10-CM | POA: Diagnosis not present

## 2015-11-11 DIAGNOSIS — Z87891 Personal history of nicotine dependence: Secondary | ICD-10-CM | POA: Diagnosis not present

## 2015-11-11 DIAGNOSIS — Z8701 Personal history of pneumonia (recurrent): Secondary | ICD-10-CM | POA: Diagnosis not present

## 2015-11-11 DIAGNOSIS — Y998 Other external cause status: Secondary | ICD-10-CM | POA: Insufficient documentation

## 2015-11-11 DIAGNOSIS — Y92410 Unspecified street and highway as the place of occurrence of the external cause: Secondary | ICD-10-CM | POA: Insufficient documentation

## 2015-11-11 DIAGNOSIS — Z791 Long term (current) use of non-steroidal anti-inflammatories (NSAID): Secondary | ICD-10-CM | POA: Insufficient documentation

## 2015-11-11 MED ORDER — METHOCARBAMOL 500 MG PO TABS: 500.0000 mg | ORAL_TABLET | Freq: Two times a day (BID) | ORAL | Status: DC

## 2015-11-11 MED ORDER — IBUPROFEN 800 MG PO TABS
800.0000 mg | ORAL_TABLET | Freq: Once | ORAL | Status: AC
  Administered 2015-11-11: 800 mg via ORAL
  Filled 2015-11-11: qty 1

## 2015-11-11 MED ORDER — IBUPROFEN 800 MG PO TABS: 800.0000 mg | ORAL_TABLET | Freq: Three times a day (TID) | ORAL | Status: DC

## 2015-11-11 MED ORDER — METHOCARBAMOL 500 MG PO TABS
1000.0000 mg | ORAL_TABLET | Freq: Once | ORAL | Status: AC
  Administered 2015-11-11: 1000 mg via ORAL
  Filled 2015-11-11: qty 2
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
## 2015-12-02 ENCOUNTER — Other Ambulatory Visit: Payer: Self-pay | Admitting: Internal Medicine

## 2016-03-02 ENCOUNTER — Other Ambulatory Visit: Payer: Self-pay | Admitting: Internal Medicine

## 2016-04-17 ENCOUNTER — Other Ambulatory Visit: Payer: Self-pay | Admitting: Internal Medicine

## 2016-04-30 ENCOUNTER — Telehealth: Payer: Self-pay | Admitting: Internal Medicine

## 2016-04-30 DIAGNOSIS — K30 Functional dyspepsia: Secondary | ICD-10-CM

## 2016-04-30 DIAGNOSIS — R002 Palpitations: Secondary | ICD-10-CM

## 2016-04-30 NOTE — Telephone Encounter (Signed)
New message     The pt was having problems, [place] urgent care [place] [address] recommended the pt to have a Stress test done. The pt would like to have one done with Dr. Lovena Le

## 2016-04-30 NOTE — Telephone Encounter (Signed)
Pt states last week she had the most severe indigestion/burning in her chest  she has ever had. Pt states the indigestion was constant for a couple of days, pt states there was no pattern to indigestion, not associated with meal/food.  Pt states that she has a stomach ulcer diagnosed about a month ago that Dr Ferdinand Lango, being treated with sulfracrate ?Marland Kitchen Pt states ulcer is better and she is due to see Dr Ferdinand Lango in a couple of weeks.  Pt states she had palpitations off and on for the last week, not associated with any other symptoms. Pt states she went to Urgent Care yesterday, had EKG and was told EKG looked okay but was advised to have a stress test done. Pt states BP yesterday was 120s/?, does not know her heart rate. Pt states she is asymptomatic at this time.  Pt is calling to arrange an appointment for a stress test.

## 2016-04-30 NOTE — Telephone Encounter (Signed)
Reviewed with Dr Lovena Le- Per Dr Vergia Alberts to schedule ETT and follow up visit with Dr Lovena Le after treadmill has been done.   Pt advised, instructions for ETT reviewed with patient, message to [facility] to contact pt to arrange appt for ETT and follow up visit with Dr Lovena Le

## 2016-05-02 ENCOUNTER — Ambulatory Visit (HOSPITAL_COMMUNITY)
Admission: RE | Admit: 2016-05-02 | Discharge: 2016-05-02 | Disposition: A | Discharge status: Home / Self Care | Payer: BLUE CROSS/BLUE SHIELD | Source: Ambulatory Visit | Attending: Internal Medicine | Admitting: Internal Medicine

## 2016-05-02 DIAGNOSIS — R002 Palpitations: Secondary | ICD-10-CM | POA: Diagnosis present

## 2016-05-02 DIAGNOSIS — R1013 Epigastric pain: Secondary | ICD-10-CM | POA: Diagnosis present

## 2016-05-02 DIAGNOSIS — K30 Functional dyspepsia: Secondary | ICD-10-CM

## 2016-05-02 LAB — EXERCISE TOLERANCE TEST
Estimated workload: 8.5 METS
Exercise duration (min): 7 min
Exercise duration (sec): 1 s
MPHR: 158 {beats}/min
Peak HR: 144 {beats}/min
Percent HR: 91 %
Rest HR: 77 {beats}/min

## 2016-05-17 ENCOUNTER — Ambulatory Visit: Payer: BLUE CROSS/BLUE SHIELD | Admitting: Physician Assistant

## 2016-06-08 ENCOUNTER — Encounter: Payer: Self-pay | Admitting: Internal Medicine

## 2016-06-08 ENCOUNTER — Ambulatory Visit (INDEPENDENT_AMBULATORY_CARE_PROVIDER_SITE_OTHER): Payer: BLUE CROSS/BLUE SHIELD | Admitting: Internal Medicine

## 2016-06-08 VITALS — BP 128/74 | HR 68 | Ht 64.0 in | Wt 150.0 lb

## 2016-06-08 DIAGNOSIS — I10 Essential (primary) hypertension: Secondary | ICD-10-CM

## 2016-06-08 DIAGNOSIS — R002 Palpitations: Secondary | ICD-10-CM

## 2016-06-08 MED ORDER — LOSARTAN POTASSIUM 50 MG PO TABS: 50.0000 mg | ORAL_TABLET | Freq: Every day | ORAL | 3 refills | Status: DC

## 2016-06-08 MED ORDER — AMLODIPINE BESYLATE 5 MG PO TABS: 5.0000 mg | ORAL_TABLET | Freq: Every day | ORAL | 3 refills | Status: DC

## 2016-06-08 MED ORDER — LABETALOL HCL 200 MG PO TABS: 200.0000 mg | ORAL_TABLET | Freq: Two times a day (BID) | ORAL | 3 refills | Status: DC

## 2016-06-08 NOTE — Patient Instructions (Addendum)
Medication Instructions:  Your physician recommends that you continue on your current medications as directed. Please refer to the Current Medication list given to you today. Refills of your Amlodipine, Losartan, and Labetalol have been sent  Labwork: None Ordered   Testing/Procedures: None Ordered   Follow-Up: Your physician recommends that you schedule a follow-up appointment in: as needed with Dr. Lovena Le   If you need a refill on your cardiac medications before your next appointment, please call your pharmacy.   Thank you for choosing [facility]! Christen Bame, RN [phone]

## 2016-06-08 NOTE — Progress Notes (Signed)
HPI Sheri Martinez returns today for followup. She is a pleasant 60 yo woman with a h/o HTN and palpitations and chest pain with no obstructive CAD by cath years ago. She has not been to our clinic for over 2 years. She noted palpitations which increase in frequency and severity when she is stressed or when she lays down to sleep. At home her blood pressure is fairly well controlled.  The palpitations have improved markedly. She denies ETOH or caffeine excess. Allergies  Allergen Reactions  . Penicillins     Dizzy. Can tolerate Amoxicillin.  . Sulfamethoxazole     REACTION: rash     Current Outpatient Prescriptions  Medication Sig Dispense Refill  . amLODipine (NORVASC) 5 MG tablet Take 1 tablet (5 mg total) by mouth daily. Please call and schedule a follow up appointment for further refills 90 tablet 2  . aspirin EC 81 MG tablet Take 81 mg by mouth daily.    . carisoprodol (SOMA) 350 MG tablet Take 1 tablet by mouth daily.    Marland Kitchen DYMISTA 137-50 MCG/ACT SUSP Place 1 spray into the nose as needed.    . fexofenadine (ALLEGRA) 60 MG tablet Take 60 mg by mouth daily as needed for allergies.     Marland Kitchen ibuprofen (ADVIL,MOTRIN) 800 MG tablet Take 1 tablet (800 mg total) by mouth 3 (three) times daily. 21 tablet 0  . labetalol (NORMODYNE) 200 MG tablet Take 1 tablet (200 mg total) by mouth 2 (two) times daily. 180 tablet 0  . losartan (COZAAR) 50 MG tablet Take 1 tablet (50 mg total) by mouth daily. 90 tablet 0  . meloxicam (MOBIC) 15 MG tablet Take 1 tablet by mouth daily.    . methocarbamol (ROBAXIN) 500 MG tablet Take 1 tablet (500 mg total) by mouth 2 (two) times daily. 20 tablet 0  . mupirocin ointment (BACTROBAN) 2 % Apply 1 application topically as needed.    Marland Kitchen omeprazole (PRILOSEC) 20 MG capsule Take 20 mg by mouth daily as needed (for acid reflux). For upset stomach     No current facility-administered medications for this visit.      Past Medical History:  Diagnosis Date  . Abdominal  pain    right upper quadrant  . Allergic rhinitis   . Angina   . Anxiety   . Bronchitis 10/12/11   "just got over it'  . Chronic gastritis   . Complication of anesthesia   . Dysrhythmia    palpitation  . GERD (gastroesophageal reflux disease)   . Hemorrhoid   . Hiatal hernia   . Hypertension   . Internal hemorrhoids   . Nausea   . Palpitations   . Pneumonia 10/12/11   "just got over it"  . PONV (postoperative nausea and vomiting)   . Shortness of breath 10/12/11   "at any time; can't relate it to anything; here w/chest tightness; just got over pneumonia/bronchitis"    ROS:   All systems reviewed and negative except as noted in the HPI.   Past Surgical History:  Procedure Laterality Date  . ABDOMINAL HYSTERECTOMY  ~ 1989   TAH; BSO; due to endometriosis  . CHOLECYSTECTOMY  02/2011  . DILATION AND CURETTAGE OF UTERUS  1980's  . NASAL SEPTUM SURGERY  1990's  . OOPHORECTOMY     (tah/bso) (off estrogen 2005)     Family History  Problem Relation Age of Onset  . Hypertension Mother   . Heart disease  grandparent  . Ovarian cancer      aunt  . Colon cancer Neg Hx      Social History   Social History  . Marital status: Married    Spouse name: N/A  . Number of children: 2  . Years of education: N/A   Occupational History  . [employer] Support [employer]   Social History Main Topics  . Smoking status: Former Smoker    Packs/day: 0.50    Years: 3.00    Types: Cigarettes    Quit date: 12/22/1978  . Smokeless tobacco: Never Used  . Alcohol use No  . Drug use: No  . Sexual activity: Yes   Other Topics Concern  . Not on file   Social History Narrative   Daily caffeine     BP 128/74   Pulse 68   Ht 5\' 4"  (1.626 m)   Wt 150 lb (68 kg)   BMI 25.75 kg/m   Physical Exam:  Well appearing 60 yo woman, NAD HEENT: Unremarkable Neck:  6 cm JVD, no thyromegally Back:  No CVA tenderness Lungs:  Clear with no wheezes HEART:  Regular rate rhythm, no murmurs,  no rubs, no clicks Abd:  soft, positive bowel sounds, no organomegally, no rebound, no guarding Ext:  2 plus pulses, no edema, no cyanosis, no clubbing Skin:  No rashes no nodules Neuro:  CN II through XII intact, motor grossly intact  EKG - nsr with poor R wave progression.  Stress test - reviewed. No ischemia or exercise induced arrhythmias.   Assess/Plan: 1. Palpitations - her symptoms are improved. We discussed the benign nature of her symptoms. She is encouraged to maintain a low caffeine diet, avoid ETOH, and call us if her symptoms worsen. If she has more symptoms, would consider a 24 hour holter to quantitate her burden of ectopy. She will continue her beta blocker for now. 2. HTN - she is well controlled today on 3 drugs. Will follow.  Mikle Bosworth.D.

## 2017-01-10 ENCOUNTER — Encounter: Payer: Self-pay | Admitting: Gastroenterology

## 2017-06-03 ENCOUNTER — Other Ambulatory Visit: Payer: Self-pay | Admitting: Internal Medicine

## 2017-07-12 ENCOUNTER — Other Ambulatory Visit: Payer: Self-pay | Admitting: Internal Medicine

## 2017-07-12 NOTE — Telephone Encounter (Signed)
Patient has not been seen by Dr Lovena Le in over one year but at last visit patient was instructed to follow up PRN. Should this be deferred to patients PCP? Please advise. Thanks, MI

## 2017-07-12 NOTE — Telephone Encounter (Signed)
Give one month.  I will follow up with Dr. Lovena Le.

## 2017-07-24 ENCOUNTER — Other Ambulatory Visit: Payer: Self-pay

## 2017-07-24 MED ORDER — AMLODIPINE BESYLATE 5 MG PO TABS: 5.0000 mg | ORAL_TABLET | Freq: Every day | ORAL | 3 refills | Status: DC

## 2017-07-24 NOTE — Telephone Encounter (Signed)
Per Dr. Lovena Le, [place] for Pt to follow with him q 2 years.  Refills may be approved with 2 year timeframe.  Will plan to see Pt 05/2018.

## 2017-08-05 ENCOUNTER — Other Ambulatory Visit: Payer: Self-pay | Admitting: *Deleted

## 2017-08-05 MED ORDER — LOSARTAN POTASSIUM 50 MG PO TABS: 50.0000 mg | ORAL_TABLET | Freq: Every day | ORAL | 2 refills | Status: DC

## 2017-08-05 MED ORDER — LOSARTAN POTASSIUM 50 MG PO TABS: 50.0000 mg | ORAL_TABLET | Freq: Every day | ORAL | 0 refills | Status: DC

## 2017-09-03 ENCOUNTER — Other Ambulatory Visit: Payer: Self-pay | Admitting: Internal Medicine

## 2017-09-04 ENCOUNTER — Other Ambulatory Visit: Payer: Self-pay | Admitting: *Deleted

## 2017-09-04 MED ORDER — LABETALOL HCL 200 MG PO TABS: ORAL_TABLET | ORAL | 0 refills | Status: DC

## 2017-10-22 ENCOUNTER — Other Ambulatory Visit: Payer: Self-pay | Admitting: *Deleted

## 2017-10-22 ENCOUNTER — Other Ambulatory Visit: Payer: Self-pay | Admitting: Internal Medicine

## 2017-10-22 MED ORDER — LABETALOL HCL 200 MG PO TABS: ORAL_TABLET | ORAL | 0 refills | Status: DC

## 2017-10-24 ENCOUNTER — Other Ambulatory Visit: Payer: Self-pay | Admitting: Internal Medicine

## 2017-10-24 MED ORDER — LABETALOL HCL 200 MG PO TABS: ORAL_TABLET | ORAL | 2 refills | Status: DC

## 2017-10-24 NOTE — Telephone Encounter (Signed)
Pt's medication was sent to pt's pharmacy per Dr. Tanna Furry nurse Willeen Cass, RN, Damian Leavell, RN  to [place]       09/03/17 3:22 PM  My note 07/24/2017-per Dr. Lovena Le Pt can be seen every 2 years and can fill scripts. Wish there was a way to flag that for Pt chart.  Ty!  Sonia Baller  Pt was last seen 05/2016. Informed pt that she would have to make an appointment with Dr. Lovena Le at that time. Pt verbalized understanding.

## 2017-12-25 ENCOUNTER — Telehealth: Payer: Self-pay | Admitting: Internal Medicine

## 2017-12-25 MED ORDER — LOSARTAN POTASSIUM 50 MG PO TABS: 50.0000 mg | ORAL_TABLET | Freq: Every day | ORAL | 3 refills | Status: DC

## 2017-12-25 NOTE — Telephone Encounter (Signed)
If patient received letter she likely was given recalled product. If patient would like to remain on losartan she may be able to get replacement from pharmacy. If she would like to change would recommend change to ACEi (I do not see that she has tried one via our chart) lisinopril 20mg  daily. Would recommend she monitor pressures for 3-4 weeks after change to ensure as effective for BP.

## 2017-12-25 NOTE — Telephone Encounter (Signed)
Sent request for Losartan  to local pharmacy to see if patient can get refilled there. Patient agreed to plan.

## 2017-12-25 NOTE — Telephone Encounter (Signed)
Pt c/o medication issue:  1. Name of Medication: Losartan   2. How are you currently taking this medication (dosage and times per day)? 50 mg 1 pill daily  3. Are you having a reaction (difficulty breathing--STAT)? no  4. What is your medication issue? Patient received letter from [facility] stating that medication was being recalled and was instructed to call doctor office.

## 2018-05-01 ENCOUNTER — Other Ambulatory Visit: Payer: Self-pay | Admitting: Ophthalmology

## 2018-05-01 DIAGNOSIS — H469 Unspecified optic neuritis: Secondary | ICD-10-CM

## 2018-05-01 DIAGNOSIS — H5340 Unspecified visual field defects: Secondary | ICD-10-CM

## 2018-05-06 ENCOUNTER — Ambulatory Visit
Admission: RE | Admit: 2018-05-06 | Discharge: 2018-05-06 | Disposition: A | Discharge status: Home / Self Care | Payer: BLUE CROSS/BLUE SHIELD | Source: Ambulatory Visit | Attending: Ophthalmology | Admitting: Ophthalmology

## 2018-05-06 DIAGNOSIS — H469 Unspecified optic neuritis: Secondary | ICD-10-CM

## 2018-05-06 DIAGNOSIS — H5340 Unspecified visual field defects: Secondary | ICD-10-CM

## 2018-05-06 MED ORDER — GADOBENATE DIMEGLUMINE 529 MG/ML IV SOLN
14.0000 mL | Freq: Once | INTRAVENOUS | Status: AC | PRN
  Administered 2018-05-06: 14 mL via INTRAVENOUS

## 2018-07-10 ENCOUNTER — Ambulatory Visit: Payer: BLUE CROSS/BLUE SHIELD | Admitting: Internal Medicine

## 2018-07-10 ENCOUNTER — Other Ambulatory Visit: Payer: Self-pay | Admitting: Internal Medicine

## 2018-07-10 ENCOUNTER — Encounter: Payer: Self-pay | Admitting: Internal Medicine

## 2018-07-10 VITALS — BP 146/82 | HR 61 | Ht 64.0 in | Wt 152.0 lb

## 2018-07-10 DIAGNOSIS — I10 Essential (primary) hypertension: Secondary | ICD-10-CM | POA: Diagnosis not present

## 2018-07-10 DIAGNOSIS — R002 Palpitations: Secondary | ICD-10-CM

## 2018-07-10 MED ORDER — LOSARTAN POTASSIUM 50 MG PO TABS: 50.0000 mg | ORAL_TABLET | Freq: Every day | ORAL | 7 refills | Status: DC

## 2018-07-10 MED ORDER — AMLODIPINE BESYLATE 5 MG PO TABS: 5.0000 mg | ORAL_TABLET | Freq: Every day | ORAL | 7 refills | Status: DC

## 2018-07-10 MED ORDER — LABETALOL HCL 200 MG PO TABS: 200.0000 mg | ORAL_TABLET | Freq: Two times a day (BID) | ORAL | 7 refills | Status: DC

## 2018-07-10 NOTE — Progress Notes (Signed)
HPI Sheri Martinez returns today for followup. She is a pleasant 62 yo woman with a h/o HTN and palpitations and chest pain with no obstructive CAD by cath years ago. She has not been to our clinic for over 2 years. She noted palpitations are improved. At home her blood pressure is fairly well controlled.  She denies ETOH or caffeine excess. She is recovering from diarrhea from eating egg salad at a bagal shop. Allergies  Allergen Reactions  . Penicillins     Dizzy. Can tolerate Amoxicillin.  . Sulfamethoxazole     REACTION: rash     Current Outpatient Medications  Medication Sig Dispense Refill  . amLODipine (NORVASC) 5 MG tablet Take 1 tablet (5 mg total) by mouth daily. Patient may see Dr. Lovena Le every 2 years.  Dr. Lovena Le will sign cardiac prescriptions requested. 90 tablet 3  . aspirin EC 81 MG tablet Take 81 mg by mouth daily.    . carisoprodol (SOMA) 350 MG tablet Take 1 tablet by mouth daily.    Marland Kitchen DYMISTA 137-50 MCG/ACT SUSP Place 1 spray into the nose as needed.    . fexofenadine (ALLEGRA) 60 MG tablet Take 60 mg by mouth daily as needed for allergies.     . fluticasone (FLONASE) 50 MCG/ACT nasal spray Place 1 spray into both nostrils as needed.    Marland Kitchen ibuprofen (ADVIL,MOTRIN) 800 MG tablet Take 1 tablet (800 mg total) by mouth 3 (three) times daily. 21 tablet 0  . labetalol (NORMODYNE) 200 MG tablet TAKE 1 TABLET BY MOUTH TWICE A DAY. Please make 2 year appt for August for future refills. Thanks 180 tablet 2  . losartan (COZAAR) 50 MG tablet Take 1 tablet (50 mg total) by mouth daily. 90 tablet 3  . meloxicam (MOBIC) 15 MG tablet Take 1 tablet by mouth daily.    . methocarbamol (ROBAXIN) 500 MG tablet Take 1 tablet (500 mg total) by mouth 2 (two) times daily. 20 tablet 0  . mupirocin ointment (BACTROBAN) 2 % Apply 1 application topically as needed.    Marland Kitchen omeprazole (PRILOSEC) 20 MG capsule Take 20 mg by mouth daily as needed (for acid reflux). For upset stomach     No current  facility-administered medications for this visit.      Past Medical History:  Diagnosis Date  . Abdominal pain    right upper quadrant  . Allergic rhinitis   . Angina   . Anxiety   . Bronchitis 10/12/11   "just got over it'  . Chronic gastritis   . Complication of anesthesia   . Dysrhythmia    palpitation  . GERD (gastroesophageal reflux disease)   . Hemorrhoid   . Hiatal hernia   . Hypertension   . Internal hemorrhoids   . Nausea   . Palpitations   . Pneumonia 10/12/11   "just got over it"  . PONV (postoperative nausea and vomiting)   . Shortness of breath 10/12/11   "at any time; can't relate it to anything; here w/chest tightness; just got over pneumonia/bronchitis"    ROS:   All systems reviewed and negative except as noted in the HPI.   Past Surgical History:  Procedure Laterality Date  . ABDOMINAL HYSTERECTOMY  ~ 1989   TAH; BSO; due to endometriosis  . CHOLECYSTECTOMY  02/2011  . DILATION AND CURETTAGE OF UTERUS  1980's  . NASAL SEPTUM SURGERY  1990's  . OOPHORECTOMY     (tah/bso) (off estrogen 2005)  Family History  Problem Relation Age of Onset  . Hypertension Mother   . Heart disease Unknown        grandparent  . Ovarian cancer Unknown        aunt  . Colon cancer Neg Hx      Social History   Socioeconomic History  . Marital status: Married    Spouse name: Not on file  . Number of children: 2  . Years of education: Not on file  . Highest education level: Not on file  Occupational History  . Occupation: [employer]    Employer: [employer]  Social Needs  . Financial resource strain: Not on file  . Food insecurity:    Worry: Not on file    Inability: Not on file  . Transportation needs:    Medical: Not on file    Non-medical: Not on file  Tobacco Use  . Smoking status: Former Smoker    Packs/day: 0.50    Years: 3.00    Pack years: 1.50    Types: Cigarettes    Last attempt to quit: 12/22/1978    Years since quitting: 39.5  .  Smokeless tobacco: Never Used  Substance and Sexual Activity  . Alcohol use: No  . Drug use: No  . Sexual activity: Yes  Lifestyle  . Physical activity:    Days per week: Not on file    Minutes per session: Not on file  . Stress: Not on file  Relationships  . Social connections:    Talks on phone: Not on file    Gets together: Not on file    Attends religious service: Not on file    Active member of club or organization: Not on file    Attends meetings of clubs or organizations: Not on file    Relationship status: Not on file  . Intimate partner violence:    Fear of current or ex partner: Not on file    Emotionally abused: Not on file    Physically abused: Not on file    Forced sexual activity: Not on file  Other Topics Concern  . Not on file  Social History Narrative   Daily caffeine     BP (!) 146/82   Pulse 61   Ht 5\' 4"  (1.626 m)   Wt 152 lb (68.9 kg)   SpO2 99%   BMI 26.09 kg/m   Physical Exam:  Well appearing NAD HEENT: Unremarkable Neck:  No JVD, no thyromegally Lymphatics:  No adenopathy Back:  No CVA tenderness Lungs:  Clear HEART:  Regular rate rhythm, no murmurs, no rubs, no clicks Abd:  soft, positive bowel sounds, no organomegally, no rebound, no guarding Ext:  2 plus pulses, no edema, no cyanosis, no clubbing Skin:  No rashes no nodules Neuro:  CN II through XII intact, motor grossly intact  EKG - nsr   Assess/Plan: 1. Palpitations - her symptoms are well controlled on her current medical therapy. 2. HTN - her blood pressure is up a bit. She will start checking it at home and recording.  Mikle Bosworth.D.

## 2018-07-10 NOTE — Patient Instructions (Signed)
Medication Instructions:  Your physician recommends that you continue on your current medications as directed. Please refer to the Current Medication list given to you today.  Labwork: None ordered.  Testing/Procedures: None ordered.  Follow-Up: Your physician wants you to follow-up in: 2 years with Dr. Taylor.   You will receive a reminder letter in the mail two months in advance. If you don't receive a letter, please call our office to schedule the follow-up appointment.   Any Other Special Instructions Will Be Listed Below (If Applicable).  If you need a refill on your cardiac medications before your next appointment, please call your pharmacy.   

## 2018-07-14 ENCOUNTER — Ambulatory Visit: Payer: BLUE CROSS/BLUE SHIELD | Admitting: Internal Medicine

## 2018-11-15 DIAGNOSIS — S060XAA Concussion with loss of consciousness status unknown, initial encounter: Secondary | ICD-10-CM

## 2018-11-15 HISTORY — DX: Concussion with loss of consciousness status unknown, initial encounter: S06.0XAA

## 2018-11-18 ENCOUNTER — Other Ambulatory Visit: Payer: Self-pay | Admitting: Orthopedic Surgery

## 2018-11-18 DIAGNOSIS — R531 Weakness: Secondary | ICD-10-CM

## 2018-11-18 DIAGNOSIS — R52 Pain, unspecified: Secondary | ICD-10-CM

## 2018-11-21 ENCOUNTER — Other Ambulatory Visit: Payer: Self-pay

## 2018-11-21 ENCOUNTER — Encounter (HOSPITAL_COMMUNITY): Payer: Self-pay

## 2018-11-21 ENCOUNTER — Emergency Department (HOSPITAL_COMMUNITY)
Admission: EM | Admit: 2018-11-21 | Discharge: 2018-11-21 | Disposition: A | Discharge status: Home / Self Care | Payer: BLUE CROSS/BLUE SHIELD | Attending: Emergency Medicine | Admitting: Emergency Medicine

## 2018-11-21 DIAGNOSIS — Z87891 Personal history of nicotine dependence: Secondary | ICD-10-CM | POA: Diagnosis not present

## 2018-11-21 DIAGNOSIS — S161XXA Strain of muscle, fascia and tendon at neck level, initial encounter: Secondary | ICD-10-CM | POA: Diagnosis not present

## 2018-11-21 DIAGNOSIS — R51 Headache: Secondary | ICD-10-CM | POA: Diagnosis not present

## 2018-11-21 DIAGNOSIS — Y998 Other external cause status: Secondary | ICD-10-CM | POA: Insufficient documentation

## 2018-11-21 DIAGNOSIS — R519 Headache, unspecified: Secondary | ICD-10-CM

## 2018-11-21 DIAGNOSIS — Y9389 Activity, other specified: Secondary | ICD-10-CM | POA: Diagnosis not present

## 2018-11-21 DIAGNOSIS — Z7982 Long term (current) use of aspirin: Secondary | ICD-10-CM | POA: Diagnosis not present

## 2018-11-21 DIAGNOSIS — S060X0A Concussion without loss of consciousness, initial encounter: Secondary | ICD-10-CM | POA: Diagnosis not present

## 2018-11-21 DIAGNOSIS — Z79899 Other long term (current) drug therapy: Secondary | ICD-10-CM | POA: Insufficient documentation

## 2018-11-21 DIAGNOSIS — I10 Essential (primary) hypertension: Secondary | ICD-10-CM | POA: Diagnosis not present

## 2018-11-21 DIAGNOSIS — Y92410 Unspecified street and highway as the place of occurrence of the external cause: Secondary | ICD-10-CM | POA: Insufficient documentation

## 2018-11-21 DIAGNOSIS — S0990XA Unspecified injury of head, initial encounter: Secondary | ICD-10-CM | POA: Diagnosis present

## 2018-11-21 DIAGNOSIS — R110 Nausea: Secondary | ICD-10-CM | POA: Insufficient documentation

## 2018-11-21 MED ORDER — METHOCARBAMOL 500 MG PO TABS: 500.0000 mg | ORAL_TABLET | Freq: Three times a day (TID) | ORAL | 0 refills | Status: DC | PRN

## 2018-11-21 MED ORDER — MELOXICAM 15 MG PO TABS: 15.0000 mg | ORAL_TABLET | Freq: Every day | ORAL | 0 refills | Status: DC

## 2018-11-21 NOTE — ED Notes (Signed)
Patient verbalizes understanding of discharge instructions. Opportunity for questioning and answers were provided. Pt discharged from ED. 
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
## 2018-11-21 NOTE — Discharge Instructions (Signed)

## 2018-11-23 ENCOUNTER — Other Ambulatory Visit: Payer: Self-pay

## 2018-11-23 ENCOUNTER — Encounter (HOSPITAL_COMMUNITY): Payer: Self-pay | Admitting: Emergency Medicine

## 2018-11-23 ENCOUNTER — Emergency Department (HOSPITAL_COMMUNITY): Payer: BLUE CROSS/BLUE SHIELD

## 2018-11-23 ENCOUNTER — Emergency Department (HOSPITAL_COMMUNITY)
Admission: EM | Admit: 2018-11-23 | Discharge: 2018-11-23 | Disposition: A | Discharge status: Home / Self Care | Payer: BLUE CROSS/BLUE SHIELD | Attending: Emergency Medicine | Admitting: Emergency Medicine

## 2018-11-23 DIAGNOSIS — S060X0A Concussion without loss of consciousness, initial encounter: Secondary | ICD-10-CM | POA: Insufficient documentation

## 2018-11-23 DIAGNOSIS — Y939 Activity, unspecified: Secondary | ICD-10-CM | POA: Insufficient documentation

## 2018-11-23 DIAGNOSIS — S060X0D Concussion without loss of consciousness, subsequent encounter: Secondary | ICD-10-CM

## 2018-11-23 DIAGNOSIS — Z79899 Other long term (current) drug therapy: Secondary | ICD-10-CM | POA: Insufficient documentation

## 2018-11-23 DIAGNOSIS — Z7982 Long term (current) use of aspirin: Secondary | ICD-10-CM | POA: Insufficient documentation

## 2018-11-23 DIAGNOSIS — Z87891 Personal history of nicotine dependence: Secondary | ICD-10-CM | POA: Insufficient documentation

## 2018-11-23 DIAGNOSIS — Y998 Other external cause status: Secondary | ICD-10-CM | POA: Diagnosis not present

## 2018-11-23 DIAGNOSIS — I10 Essential (primary) hypertension: Secondary | ICD-10-CM | POA: Diagnosis not present

## 2018-11-23 DIAGNOSIS — Y92410 Unspecified street and highway as the place of occurrence of the external cause: Secondary | ICD-10-CM | POA: Insufficient documentation

## 2018-11-23 DIAGNOSIS — R51 Headache: Secondary | ICD-10-CM | POA: Diagnosis present

## 2018-11-23 MED ORDER — ONDANSETRON 4 MG PO TBDP
4.0000 mg | ORAL_TABLET | Freq: Once | ORAL | Status: AC
  Administered 2018-11-23: 4 mg via ORAL
  Filled 2018-11-23: qty 1

## 2018-11-23 MED ORDER — KETOROLAC TROMETHAMINE 60 MG/2ML IM SOLN
60.0000 mg | Freq: Once | INTRAMUSCULAR | Status: AC
  Administered 2018-11-23: 60 mg via INTRAMUSCULAR
  Filled 2018-11-23: qty 2

## 2018-11-23 MED ORDER — ACETAMINOPHEN 325 MG PO TABS
650.0000 mg | ORAL_TABLET | Freq: Once | ORAL | Status: AC
  Administered 2018-11-23: 650 mg via ORAL
  Filled 2018-11-23: qty 2

## 2018-11-23 NOTE — Discharge Instructions (Signed)
Please take mobic for your symptoms. Please resume daily duties as tolerated.  Avoid any activities that could lead to reinjury.  Please follow-up with your regular doctor in regards to your symptoms.  Return to the ER for new or worsening symptoms in the meantime including severe headaches, numbness, weakness, dizziness, vision changes.
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
## 2018-11-29 ENCOUNTER — Ambulatory Visit
Admission: RE | Admit: 2018-11-29 | Discharge: 2018-11-29 | Disposition: A | Discharge status: Home / Self Care | Payer: BLUE CROSS/BLUE SHIELD | Source: Ambulatory Visit | Attending: Orthopedic Surgery | Admitting: Orthopedic Surgery

## 2018-11-29 DIAGNOSIS — R531 Weakness: Secondary | ICD-10-CM

## 2018-11-29 DIAGNOSIS — R52 Pain, unspecified: Secondary | ICD-10-CM

## 2018-12-02 ENCOUNTER — Emergency Department (HOSPITAL_COMMUNITY): Payer: BLUE CROSS/BLUE SHIELD

## 2018-12-02 ENCOUNTER — Emergency Department (HOSPITAL_COMMUNITY)
Admission: EM | Admit: 2018-12-02 | Discharge: 2018-12-02 | Disposition: A | Discharge status: Home / Self Care | Payer: BLUE CROSS/BLUE SHIELD | Attending: Emergency Medicine | Admitting: Emergency Medicine

## 2018-12-02 ENCOUNTER — Encounter (HOSPITAL_COMMUNITY): Payer: Self-pay | Admitting: Emergency Medicine

## 2018-12-02 DIAGNOSIS — F0781 Postconcussional syndrome: Secondary | ICD-10-CM | POA: Diagnosis not present

## 2018-12-02 DIAGNOSIS — Z79899 Other long term (current) drug therapy: Secondary | ICD-10-CM | POA: Diagnosis not present

## 2018-12-02 DIAGNOSIS — Z87891 Personal history of nicotine dependence: Secondary | ICD-10-CM | POA: Diagnosis not present

## 2018-12-02 DIAGNOSIS — Z7982 Long term (current) use of aspirin: Secondary | ICD-10-CM | POA: Insufficient documentation

## 2018-12-02 DIAGNOSIS — R519 Headache, unspecified: Secondary | ICD-10-CM

## 2018-12-02 DIAGNOSIS — R51 Headache: Secondary | ICD-10-CM | POA: Diagnosis not present

## 2018-12-02 DIAGNOSIS — I10 Essential (primary) hypertension: Secondary | ICD-10-CM | POA: Insufficient documentation

## 2018-12-02 MED ORDER — KETOROLAC TROMETHAMINE 30 MG/ML IJ SOLN
30.0000 mg | Freq: Once | INTRAMUSCULAR | Status: AC
  Administered 2018-12-02: 30 mg via INTRAMUSCULAR
  Filled 2018-12-02: qty 1

## 2018-12-02 MED ORDER — ONDANSETRON 4 MG PO TBDP
4.0000 mg | ORAL_TABLET | Freq: Once | ORAL | Status: AC
  Administered 2018-12-02: 4 mg via ORAL
  Filled 2018-12-02: qty 1

## 2018-12-02 NOTE — ED Notes (Signed)
Pt ambulatory to bathroom with no reported issues. 
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
## 2018-12-02 NOTE — Discharge Instructions (Addendum)
You have been seen today for headache. Please read and follow all provided instructions.   1. Medications: tylenol/ibuprofen for pain, usual home medications 2. Treatment: rest, drink plenty of fluids 3. Follow Up: Please follow up with your primary doctor in 2 days for discussion of your diagnoses and further evaluation after today's visit; if you do not have a primary care doctor use the resource guide provided to find one; Please return to the ER for any new or worsening symptoms. Please obtain all of your results from medical records or have your doctors office obtain the results - share them with your doctor - you should be seen at your doctors office. Call today to arrange your follow up.   Take medications as prescribed. Please review all of the medicines and only take them if you do not have an allergy to them. Return to the emergency room for worsening condition or new concerning symptoms. Follow up with your regular doctor. If you don't have a regular doctor use one of the numbers below to establish a primary care doctor.  Please be aware that if you are taking birth control pills, taking other prescriptions, ESPECIALLY ANTIBIOTICS may make the birth control ineffective - if this is the case, either do not engage in sexual activity or use alternative methods of birth control such as condoms until you have finished the medicine and your family doctor says it is OK to restart them. If you are on a blood thinner such as COUMADIN, be aware that any other medicine that you take may cause the coumadin to either work too much, or not enough - you should have your coumadin level rechecked in next 7 days if this is the case.  ?  It is also a possibility that you have an allergic reaction to any of the medicines that you have been prescribed - Everybody reacts differently to medications and while MOST people have no trouble with most medicines, you may have a reaction such as nausea, vomiting, rash,  swelling, shortness of breath. If this is the case, please stop taking the medicine immediately and contact your physician.  ?  You should return to the ER if you develop severe or worsening symptoms.   Emergency Department Resource Guide 1) Find a Doctor and Pay Out of Pocket Although you won't have to find out who is covered by your insurance plan, it is a good idea to ask around and get recommendations. You will then need to call the office and see if the doctor you have chosen will accept you as a new patient and what types of options they offer for patients who are self-pay. Some doctors offer discounts or will set up payment plans for their patients who do not have insurance, but you will need to ask so you aren't surprised when you get to your appointment.  2) Contact Your Local Health Department Not all health departments have doctors that can see patients for sick visits, but many do, so it is worth a call to see if yours does. If you don't know where your local health department is, you can check in your phone book. The CDC also has a tool to help you locate your state's health department, and many state websites also have listings of all of their local health departments.  3) Find a [facility] If your illness is not likely to be very severe or complicated, you may want to try a walk in clinic. These are popping up  all over the country in pharmacies, drugstores, and shopping centers. They're usually staffed by nurse practitioners or physician assistants that have been trained to treat common illnesses and complaints. They're usually fairly quick and inexpensive. However, if you have serious medical issues or chronic medical problems, these are probably not your best option.  No Primary Care Doctor: Call Health Connect at  [phone] - they can help you locate a primary care doctor that  accepts your insurance, provides certain services, etc. Physician Referral [phone]  Emergency Department Resource Guide 1) Find a Doctor and Pay Out of Pocket Although you won't have to find out who is covered by your insurance plan, it is a good idea to ask around and get recommendations. You will then need to call the office and see if the doctor you have chosen will accept you as a new patient and what types of options they offer for patients who are self-pay. Some doctors offer discounts or will set up payment plans for their patients who do not have insurance, but you will need to ask so you aren't surprised when you get to your appointment.  2) Contact Your Local Health Department Not all health departments have doctors that can see patients for sick visits, but many do, so it is worth a call to see if yours does. If you don't know where your local health department is, you can check in your phone book. The CDC also has a tool to help you locate your state's health department, and many state websites also have listings of all of their local health departments.  3) Find a [facility] If your illness is not likely to be very severe or complicated, you may want to try a walk in clinic. These are popping up all over the country in pharmacies, drugstores, and shopping centers. They're usually staffed by nurse practitioners or physician assistants that have been trained to treat common illnesses and complaints. They're usually fairly quick and inexpensive. However, if you have serious medical issues or chronic medical problems, these are probably not your best option.  No Primary Care Doctor: Call Health Connect at  [phone] - they can help you locate a primary care doctor that  accepts your insurance, provides certain services, etc. Physician Referral Service- [phone]  Chronic Pain Problems: Organization         Address  Phone   Notes  [facility]  [phone] Patients need to be referred by their primary care doctor.    Medication Assistance: Organization         Address  Phone   Notes  [facility] Medication [facility] [address] [place], [place] [address] [phone] --Must be a resident of [facility] -- Must have NO insurance coverage whatsoever (no Medicaid/ Medicare, etc.) -- The pt. MUST have a primary care doctor that directs their care regularly and follows them in the community   [facility]  [phone]   [employer]  [phone]    Agencies that provide inexpensive medical care: Organization         Address  Phone   Notes  [place]  [phone]   [facility]    [phone]   [facility] [place], [place] [address] [phone]   [place] [address], [place] [phone]   [facility]    [phone]  [facility]    [phone]   [facility]  [address], [place] Phone:  [phone], Fax:  [phone] Hours of Operation:  9 am - 6 pm, M-F.  Also accepts Medicaid/Medicare and self-pay.  [facility] for [place], [address], [place] Phone: [phone], Fax: [phone]. Hours of Operation:  8:30 am - 5:30 pm, M-F.  Also accepts Medicaid and self-pay.  [facility] [address] [place] Phone: [phone]   [place], [place], [place] [phone], Ext. [phone] Mondays & Thursdays: 7-9 AM.  First 15 patients are seen on a first come, first serve basis.    [place] Providers:  Organization         Address  Phone   Notes  [facility] [address], [place] [phone] Also accepts self-pay patients.  [facility] [address], [place]  [phone]   [place], [address], [place] [phone]   [facility] [address], [place] [phone]   Lucianne Lei [address], [place]   [phone] Only accepts [place] [facility] [place] patients after they have their name applied to their card.   Self-Pay (no insurance) in [facility]:  Organization         Address  Phone   Notes  Sickle Cell Patients, [facility] [place] [phone]   [facility] [place] [phone]   [facility] [place]  [place], [place], [place] [phone]   [facility]/Dr. Osei-Bonsu  [address] [place] or [address], [place] [phone] Phone number for both [place] and [place] locations is the same.  [facility] [address] [place] [phone]   [facility] [address] [place] or [address] [phone]   [facility] [address], [place] [phone], phone; [phone], fax Sees patients 1st and 3rd Saturday of every month.  Must not qualify for public or private insurance (i.e. Medicaid, Medicare, [place] [employer] Choice, Veterans' Benefits)  Household income should be no more than 200% of the poverty level The clinic cannot treat you if you are pregnant or think you are pregnant  Sexually transmitted diseases are not treated at the clinic.
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
## 2019-05-11 ENCOUNTER — Other Ambulatory Visit: Payer: Self-pay

## 2019-05-11 DIAGNOSIS — Z20822 Contact with and (suspected) exposure to covid-19: Secondary | ICD-10-CM

## 2019-05-13 LAB — NOVEL CORONAVIRUS, NAA: SARS-CoV-2, NAA: NOT DETECTED

## 2019-09-29 DIAGNOSIS — J069 Acute upper respiratory infection, unspecified: Secondary | ICD-10-CM | POA: Diagnosis not present

## 2019-09-29 DIAGNOSIS — R05 Cough: Secondary | ICD-10-CM | POA: Diagnosis not present

## 2019-09-29 DIAGNOSIS — R0981 Nasal congestion: Secondary | ICD-10-CM | POA: Diagnosis not present

## 2019-09-29 DIAGNOSIS — Z03818 Encounter for observation for suspected exposure to other biological agents ruled out: Secondary | ICD-10-CM | POA: Diagnosis not present

## 2019-10-03 ENCOUNTER — Other Ambulatory Visit: Payer: Self-pay | Admitting: Internal Medicine

## 2019-10-24 DIAGNOSIS — U071 COVID-19: Secondary | ICD-10-CM | POA: Diagnosis not present

## 2019-10-24 DIAGNOSIS — Z9189 Other specified personal risk factors, not elsewhere classified: Secondary | ICD-10-CM | POA: Diagnosis not present

## 2019-10-24 DIAGNOSIS — Z20822 Contact with and (suspected) exposure to covid-19: Secondary | ICD-10-CM | POA: Diagnosis not present

## 2019-10-28 DIAGNOSIS — J329 Chronic sinusitis, unspecified: Secondary | ICD-10-CM | POA: Diagnosis not present

## 2019-10-28 DIAGNOSIS — Z9189 Other specified personal risk factors, not elsewhere classified: Secondary | ICD-10-CM | POA: Diagnosis not present

## 2019-11-01 DIAGNOSIS — R509 Fever, unspecified: Secondary | ICD-10-CM | POA: Diagnosis not present

## 2019-12-05 ENCOUNTER — Ambulatory Visit: Payer: Self-pay | Attending: Internal Medicine

## 2019-12-05 DIAGNOSIS — Z23 Encounter for immunization: Secondary | ICD-10-CM | POA: Insufficient documentation

## 2019-12-05 NOTE — Progress Notes (Signed)
   Covid-19 Vaccination Clinic  Name:  Sheri Martinez    MRN: [id number] DOB: Feb 09, 1956  12/05/2019  [name] was observed post Covid-19 immunization for 15 minutes without incidence. She was provided with Vaccine Information Sheet and instruction to access the V-Safe system.   [name] was instructed to call 911 with any severe reactions post vaccine: Marland Kitchen Difficulty breathing  . Swelling of your face and throat  . A fast heartbeat  . A bad rash all over your body  . Dizziness and weakness    Immunizations Administered    Name Date Dose VIS Date Route   Pfizer COVID-19 Vaccine 12/05/2019  2:12 PM 0.3 mL 09/25/2019 Intramuscular   Manufacturer: [place]   Lot: [id number]   [place]: [id number]

## 2019-12-29 ENCOUNTER — Ambulatory Visit: Payer: Self-pay | Attending: Internal Medicine

## 2019-12-29 DIAGNOSIS — Z23 Encounter for immunization: Secondary | ICD-10-CM

## 2019-12-29 NOTE — Progress Notes (Signed)
   Covid-19 Vaccination Clinic  Name:  Darly Schave    MRN: [id number] DOB: [birth year]  12/29/2019  Ms. Keir was observed post Covid-19 immunization for 15 minutes without incident. She was provided with Vaccine Information Sheet and instruction to access the V-Safe system.   Ms. Andert was instructed to call 911 with any severe reactions post vaccine: Marland Kitchen Difficulty breathing  . Swelling of face and throat  . A fast heartbeat  . A bad rash all over body  . Dizziness and weakness   Immunizations Administered    Name Date Dose VIS Date Route   Pfizer COVID-19 Vaccine 12/29/2019 10:10 AM 0.3 mL 09/25/2019 Intramuscular   Manufacturer: [place]   Lot: [id number]   [place]: [id number]

## 2020-01-05 ENCOUNTER — Other Ambulatory Visit: Payer: Self-pay | Admitting: Internal Medicine

## 2020-02-27 DIAGNOSIS — J069 Acute upper respiratory infection, unspecified: Secondary | ICD-10-CM | POA: Diagnosis not present

## 2020-02-27 DIAGNOSIS — J018 Other acute sinusitis: Secondary | ICD-10-CM | POA: Diagnosis not present

## 2020-04-18 DIAGNOSIS — M1380 Other specified arthritis, unspecified site: Secondary | ICD-10-CM | POA: Diagnosis not present

## 2020-04-18 DIAGNOSIS — Z79899 Other long term (current) drug therapy: Secondary | ICD-10-CM | POA: Diagnosis not present

## 2020-04-18 DIAGNOSIS — Z20822 Contact with and (suspected) exposure to covid-19: Secondary | ICD-10-CM | POA: Diagnosis not present

## 2020-04-18 DIAGNOSIS — L299 Pruritus, unspecified: Secondary | ICD-10-CM | POA: Diagnosis not present

## 2020-04-18 DIAGNOSIS — R05 Cough: Secondary | ICD-10-CM | POA: Diagnosis not present

## 2020-04-18 DIAGNOSIS — W57XXXA Bitten or stung by nonvenomous insect and other nonvenomous arthropods, initial encounter: Secondary | ICD-10-CM | POA: Diagnosis not present

## 2020-04-18 DIAGNOSIS — J018 Other acute sinusitis: Secondary | ICD-10-CM | POA: Diagnosis not present

## 2020-04-19 DIAGNOSIS — Z1231 Encounter for screening mammogram for malignant neoplasm of breast: Secondary | ICD-10-CM | POA: Diagnosis not present

## 2020-05-14 DIAGNOSIS — J019 Acute sinusitis, unspecified: Secondary | ICD-10-CM | POA: Diagnosis not present

## 2020-05-14 DIAGNOSIS — J45909 Unspecified asthma, uncomplicated: Secondary | ICD-10-CM | POA: Diagnosis not present

## 2020-05-14 DIAGNOSIS — J40 Bronchitis, not specified as acute or chronic: Secondary | ICD-10-CM | POA: Diagnosis not present

## 2020-05-14 DIAGNOSIS — J309 Allergic rhinitis, unspecified: Secondary | ICD-10-CM | POA: Diagnosis not present

## 2020-06-01 DIAGNOSIS — J069 Acute upper respiratory infection, unspecified: Secondary | ICD-10-CM | POA: Diagnosis not present

## 2020-06-01 DIAGNOSIS — Z9189 Other specified personal risk factors, not elsewhere classified: Secondary | ICD-10-CM | POA: Diagnosis not present

## 2020-06-01 DIAGNOSIS — Z20822 Contact with and (suspected) exposure to covid-19: Secondary | ICD-10-CM | POA: Diagnosis not present

## 2020-06-15 ENCOUNTER — Other Ambulatory Visit: Payer: Self-pay | Admitting: Internal Medicine

## 2020-06-23 DIAGNOSIS — R0602 Shortness of breath: Secondary | ICD-10-CM | POA: Diagnosis not present

## 2020-06-23 DIAGNOSIS — J069 Acute upper respiratory infection, unspecified: Secondary | ICD-10-CM | POA: Diagnosis not present

## 2020-07-05 ENCOUNTER — Other Ambulatory Visit: Payer: Self-pay | Admitting: Internal Medicine

## 2020-08-03 DIAGNOSIS — K589 Irritable bowel syndrome without diarrhea: Secondary | ICD-10-CM | POA: Diagnosis not present

## 2020-08-03 DIAGNOSIS — K529 Noninfective gastroenteritis and colitis, unspecified: Secondary | ICD-10-CM | POA: Diagnosis not present

## 2020-08-03 DIAGNOSIS — R1013 Epigastric pain: Secondary | ICD-10-CM | POA: Diagnosis not present

## 2020-08-03 DIAGNOSIS — Z20822 Contact with and (suspected) exposure to covid-19: Secondary | ICD-10-CM | POA: Diagnosis not present

## 2020-08-05 ENCOUNTER — Ambulatory Visit: Payer: Self-pay | Admitting: Internal Medicine

## 2020-08-13 ENCOUNTER — Ambulatory Visit: Payer: BC Managed Care – PPO | Attending: Internal Medicine

## 2020-08-13 DIAGNOSIS — Z23 Encounter for immunization: Secondary | ICD-10-CM

## 2020-08-13 NOTE — Progress Notes (Signed)
   Covid-19 Vaccination Clinic  Name:  Neri Vieyra    MRN: [id number] DOB: 1956-03-11  08/13/2020  Ms. Rodick was observed post Covid-19 immunization for 15 minutes without incident. She was provided with Vaccine Information Sheet and instruction to access the V-Safe system.   Ms. Bassinger was instructed to call 911 with any severe reactions post vaccine: Marland Kitchen Difficulty breathing  . Swelling of face and throat  . A fast heartbeat  . A bad rash all over body  . Dizziness and weakness

## 2020-08-15 ENCOUNTER — Other Ambulatory Visit: Payer: Self-pay | Admitting: Internal Medicine

## 2020-08-15 MED ORDER — LOSARTAN POTASSIUM 50 MG PO TABS: ORAL_TABLET | ORAL | 0 refills | Status: DC

## 2020-08-15 MED ORDER — AMLODIPINE BESYLATE 5 MG PO TABS: ORAL_TABLET | ORAL | 0 refills | Status: DC

## 2020-08-15 MED ORDER — LABETALOL HCL 200 MG PO TABS: ORAL_TABLET | ORAL | 0 refills | Status: DC

## 2020-08-15 NOTE — Telephone Encounter (Signed)
*  STAT* If patient is at the pharmacy, call can be transferred to refill team.   1. Which medications need to be refilled? (please list name of each medication and dose if known)  labetalol (NORMODYNE) 200 MG tablet losartan (COZAAR) 50 MG tablet  amLODipine (NORVASC) 5 MG tablet     2. Which pharmacy/location (including street and city if local pharmacy) is medication to be sent to? [facility] - [address], [place], [place] [address]  3. Do they need a 30 day or 90 day supply? 90 day supply   Patient is completely out of medication.

## 2020-08-16 NOTE — Progress Notes (Signed)
Cardiology Office Note Date:  08/17/2020  Patient ID:  Sheri Martinez, DOB 03/20/1956, MRN [id number] PCP:  Pcp, No  Cardiologist/Electrophysiologist: Dr. Lovena Le    Chief Complaint: biannual visit  History of Present Illness: Sheri Martinez is a 64 y.o. female with history of HTN.  She comes today to be seen for Dr. Lovena Le, last seen by him in 2019.  At that time had been a couple years since her visit prior. She was doing well, BP was a little high and asked to keep an eye on it at home.  No changes were made.   I do not see any formal diagnosis of an arrhythmia.    She is doing quite well.  She continues to work full time, stays very active and busy. She does not formally exercise but describes herself as quite active, enjouys working in the yard, and denies any kind of symptoms or exertional intolerances. No CP, palpitations or cardiac awareness. No dizzy spells, near syncope or syncope. No SOB or DOE    Past Medical History:  Diagnosis Date  . Abdominal pain    right upper quadrant  . Allergic rhinitis   . Angina   . Anxiety   . Bronchitis 10/12/11   "just got over it'  . Chronic gastritis   . Complication of anesthesia   . Dysrhythmia    palpitation  . GERD (gastroesophageal reflux disease)   . Hemorrhoid   . Hiatal hernia   . Hypertension   . Internal hemorrhoids   . Nausea   . Palpitations   . Pneumonia 10/12/11   "just got over it"  . PONV (postoperative nausea and vomiting)   . Shortness of breath 10/12/11   "at any time; can't relate it to anything; here w/chest tightness; just got over pneumonia/bronchitis"    Past Surgical History:  Procedure Laterality Date  . ABDOMINAL HYSTERECTOMY  ~ 1989   TAH; BSO; due to endometriosis  . CHOLECYSTECTOMY  02/2011  . DILATION AND CURETTAGE OF UTERUS  1980's  . NASAL SEPTUM SURGERY  1990's  . OOPHORECTOMY     (tah/bso) (off estrogen 2005)    Current Outpatient Medications  Medication Sig Dispense Refill  .  amLODipine (NORVASC) 5 MG tablet TAKE 1 TABLET DAILY 90 tablet 4  . aspirin EC 81 MG tablet Take 81 mg by mouth daily.    Marland Kitchen DYMISTA 137-50 MCG/ACT SUSP Place 1 spray into the nose as needed.    . fexofenadine (ALLEGRA) 60 MG tablet Take 60 mg by mouth daily as needed for allergies.     . fluticasone (FLONASE) 50 MCG/ACT nasal spray Place 1 spray into both nostrils as needed.    Marland Kitchen ibuprofen (ADVIL,MOTRIN) 800 MG tablet Take 1 tablet (800 mg total) by mouth 3 (three) times daily. 21 tablet 0  . labetalol (NORMODYNE) 200 MG tablet TAKE 1 TABLET TWICE A DAY 180 tablet 4  . losartan (COZAAR) 50 MG tablet TAKE 1 TABLET DAILY 90 tablet 4  . meloxicam (MOBIC) 15 MG tablet Take 1 tablet (15 mg total) by mouth daily. Take 1 daily with food. 10 tablet 0  . methocarbamol (ROBAXIN) 500 MG tablet Take 1 tablet (500 mg total) by mouth 3 (three) times daily as needed for muscle spasms. 20 tablet 0  . omeprazole (PRILOSEC) 20 MG capsule Take 20 mg by mouth daily as needed (for acid reflux). For upset stomach     No current facility-administered medications for this visit.    Allergies:  Penicillins and Sulfamethoxazole   Social History:  The patient  reports that she quit smoking about 41 years ago. Her smoking use included cigarettes. She has a 1.50 pack-year smoking history. She has never used smokeless tobacco. She reports that she does not drink alcohol and does not use drugs.   Family History:  The patient's family history includes Heart disease in an other family member; Hypertension in her mother; Ovarian cancer in an other family member.  ROS:  Please see the history of present illness.    All other systems are reviewed and otherwise negative.   PHYSICAL EXAM:  VS:  BP 130/80   Pulse 76   Ht 5\' 4"  (1.626 m)   Wt 149 lb 6.4 oz (67.8 kg)   BMI 25.64 kg/m  BMI: Body mass index is 25.64 kg/m. Well nourished, well developed, in no acute distress HEENT: normocephalic, atraumatic Neck: no JVD,  carotid bruits or masses Cardiac:  RRR; no significant murmurs, no rubs, or gallops Lungs:  CTA b/l, no wheezing, rhonchi or rales Abd: soft, nontender MS: no deformity or atrophy Ext: no edema Skin: warm and dry, no rash Neuro:  No gross deficits appreciated Psych: euthymic mood, full affect   EKG:  Done today and reviewed by myself shows  SR 76bpm, unchanged  05/02/2016: stress test  There was no ST segment deviation noted during stress   10/12/2011: TTE Study Conclusions  Left ventricle: The cavity size was normal. Wall thickness  was normal. Systolic function was normal. The estimated  ejection fraction was in the range of 60% to 65%.    Recent Labs: No results found for requested labs within last 8760 hours.  No results found for requested labs within last 8760 hours.   CrCl cannot be calculated (Patient's most recent lab result is older than the maximum 21 days allowed.).   Wt Readings from Last 3 Encounters:  08/17/20 149 lb 6.4 oz (67.8 kg)  12/02/18 139 lb 15.9 oz (63.5 kg)  11/21/18 140 lb (63.5 kg)     Other studies reviewed: Additional studies/records reviewed today include: summarized above  ASSESSMENT AND PLAN:  1. HTN     Looks OK, she checks periodically at home, usually a bit lower then ere  2. Palpitations     Remain resolved  We discussed heart healthy life style. She is encouraged to establish with a PMD   Disposition: we will continue with biannual visits, sooner if needed.   Current medicines are reviewed at length with the patient today.  The patient did not have any concerns regarding medicines.  Venetia Night, PA-C 08/17/2020 8:35 AM     [address] [place] [address] [phone] (office)  [phone] (fax)

## 2020-08-17 ENCOUNTER — Encounter: Payer: Self-pay | Admitting: Physician Assistant

## 2020-08-17 ENCOUNTER — Other Ambulatory Visit: Payer: Self-pay

## 2020-08-17 ENCOUNTER — Ambulatory Visit: Payer: BC Managed Care – PPO | Admitting: Physician Assistant

## 2020-08-17 VITALS — BP 130/80 | HR 76 | Ht 64.0 in | Wt 149.4 lb

## 2020-08-17 DIAGNOSIS — I10 Essential (primary) hypertension: Secondary | ICD-10-CM | POA: Diagnosis not present

## 2020-08-17 DIAGNOSIS — R002 Palpitations: Secondary | ICD-10-CM

## 2020-08-17 MED ORDER — AMLODIPINE BESYLATE 5 MG PO TABS: ORAL_TABLET | ORAL | 4 refills | Status: DC

## 2020-08-17 MED ORDER — LOSARTAN POTASSIUM 50 MG PO TABS: ORAL_TABLET | ORAL | 4 refills | Status: DC

## 2020-08-17 MED ORDER — LABETALOL HCL 200 MG PO TABS: ORAL_TABLET | ORAL | 4 refills | Status: DC

## 2020-08-17 NOTE — Patient Instructions (Signed)
Medication Instructions:   Your physician recommends that you continue on your current medications as directed. Please refer to the Current Medication list given to you today.   *If you need a refill on your cardiac medications before your next appointment, please call your pharmacy*   Lab Work: [place]  If you have labs (blood work) drawn today and your tests are completely normal, you will receive your results only by: Marland Kitchen MyChart Message (if you have MyChart) OR . A paper copy in the mail If you have any lab test that is abnormal or we need to change your treatment, we will call you to review the results.   Testing/Procedures: NONE ORDERED  TODAY   Follow-Up: At [facility], you and your health needs are our priority.  As part of our continuing mission to provide you with exceptional heart care, we have created designated Provider Care Teams.  These Care Teams include your primary Cardiologist (physician) and Advanced Practice Providers (APPs -  Physician Assistants and Nurse Practitioners) who all work together to provide you with the care you need, when you need it.  We recommend signing up for the patient portal called "MyChart".  Sign up information is provided on this After Visit Summary.  MyChart is used to connect with patients for Virtual Visits (Telemedicine).  Patients are able to view lab/test results, encounter notes, upcoming appointments, etc.  Non-urgent messages can be sent to your provider as well.   To learn more about what you can do with MyChart, go to [email or web address].    Your next appointment:   2 year(s)  The format for your next appointment:   In Person  Provider:   You may see Dr. Lovena Le  or one of the following Advanced Practice Providers on your designated Care Team:    Tommye Standard, [place]    Other Instructions  PLEASE GET ESTABLISHED WITH A PRIMARY CARE PROVIDER

## 2020-08-20 DIAGNOSIS — R519 Headache, unspecified: Secondary | ICD-10-CM | POA: Diagnosis not present

## 2020-09-07 DIAGNOSIS — Z20822 Contact with and (suspected) exposure to covid-19: Secondary | ICD-10-CM | POA: Diagnosis not present

## 2020-09-10 DIAGNOSIS — B9689 Other specified bacterial agents as the cause of diseases classified elsewhere: Secondary | ICD-10-CM | POA: Diagnosis not present

## 2020-09-10 DIAGNOSIS — Z20822 Contact with and (suspected) exposure to covid-19: Secondary | ICD-10-CM | POA: Diagnosis not present

## 2020-09-10 DIAGNOSIS — J329 Chronic sinusitis, unspecified: Secondary | ICD-10-CM | POA: Diagnosis not present

## 2020-10-12 DIAGNOSIS — R059 Cough, unspecified: Secondary | ICD-10-CM | POA: Diagnosis not present

## 2020-10-12 DIAGNOSIS — J329 Chronic sinusitis, unspecified: Secondary | ICD-10-CM | POA: Diagnosis not present

## 2020-10-12 DIAGNOSIS — R0981 Nasal congestion: Secondary | ICD-10-CM | POA: Diagnosis not present

## 2020-10-12 DIAGNOSIS — Z20822 Contact with and (suspected) exposure to covid-19: Secondary | ICD-10-CM | POA: Diagnosis not present

## 2020-11-25 DIAGNOSIS — H2513 Age-related nuclear cataract, bilateral: Secondary | ICD-10-CM | POA: Diagnosis not present

## 2020-11-25 DIAGNOSIS — H5203 Hypermetropia, bilateral: Secondary | ICD-10-CM | POA: Diagnosis not present

## 2020-11-25 DIAGNOSIS — H4720 Unspecified optic atrophy: Secondary | ICD-10-CM | POA: Diagnosis not present

## 2020-11-27 DIAGNOSIS — R059 Cough, unspecified: Secondary | ICD-10-CM | POA: Diagnosis not present

## 2020-11-27 DIAGNOSIS — J069 Acute upper respiratory infection, unspecified: Secondary | ICD-10-CM | POA: Diagnosis not present

## 2020-11-27 DIAGNOSIS — Z20822 Contact with and (suspected) exposure to covid-19: Secondary | ICD-10-CM | POA: Diagnosis not present

## 2020-11-27 DIAGNOSIS — R0981 Nasal congestion: Secondary | ICD-10-CM | POA: Diagnosis not present

## 2021-01-27 ENCOUNTER — Encounter (HOSPITAL_BASED_OUTPATIENT_CLINIC_OR_DEPARTMENT_OTHER): Payer: Self-pay

## 2021-01-27 ENCOUNTER — Other Ambulatory Visit (HOSPITAL_BASED_OUTPATIENT_CLINIC_OR_DEPARTMENT_OTHER): Payer: Self-pay

## 2021-01-27 ENCOUNTER — Emergency Department (HOSPITAL_BASED_OUTPATIENT_CLINIC_OR_DEPARTMENT_OTHER): Payer: BC Managed Care – PPO | Admitting: Radiology

## 2021-01-27 ENCOUNTER — Emergency Department (HOSPITAL_BASED_OUTPATIENT_CLINIC_OR_DEPARTMENT_OTHER)
Admission: EM | Admit: 2021-01-27 | Discharge: 2021-01-27 | Disposition: A | Discharge status: Home / Self Care | Payer: BC Managed Care – PPO | Attending: Emergency Medicine | Admitting: Emergency Medicine

## 2021-01-27 ENCOUNTER — Other Ambulatory Visit: Payer: Self-pay

## 2021-01-27 DIAGNOSIS — M7989 Other specified soft tissue disorders: Secondary | ICD-10-CM | POA: Diagnosis not present

## 2021-01-27 DIAGNOSIS — Y9389 Activity, other specified: Secondary | ICD-10-CM | POA: Diagnosis not present

## 2021-01-27 DIAGNOSIS — Z79899 Other long term (current) drug therapy: Secondary | ICD-10-CM | POA: Insufficient documentation

## 2021-01-27 DIAGNOSIS — Z87891 Personal history of nicotine dependence: Secondary | ICD-10-CM | POA: Diagnosis not present

## 2021-01-27 DIAGNOSIS — S9781XA Crushing injury of right foot, initial encounter: Secondary | ICD-10-CM | POA: Insufficient documentation

## 2021-01-27 DIAGNOSIS — S9701XA Crushing injury of right ankle, initial encounter: Secondary | ICD-10-CM | POA: Insufficient documentation

## 2021-01-27 DIAGNOSIS — Z7982 Long term (current) use of aspirin: Secondary | ICD-10-CM | POA: Diagnosis not present

## 2021-01-27 DIAGNOSIS — I10 Essential (primary) hypertension: Secondary | ICD-10-CM | POA: Insufficient documentation

## 2021-01-27 DIAGNOSIS — W208XXA Other cause of strike by thrown, projected or falling object, initial encounter: Secondary | ICD-10-CM | POA: Insufficient documentation

## 2021-01-27 DIAGNOSIS — S99921A Unspecified injury of right foot, initial encounter: Secondary | ICD-10-CM | POA: Diagnosis not present

## 2021-01-27 MED ORDER — HYDROCODONE-ACETAMINOPHEN 5-325 MG PO TABS
1.0000 | ORAL_TABLET | ORAL | 0 refills | Status: DC | PRN
  Filled 2021-01-27: qty 10, 2d supply, fill #0

## 2021-01-27 NOTE — ED Triage Notes (Signed)
Pt came In for right foot injury - states a  Concrete  Fell over on her right foot  Yesterday -

## 2021-01-27 NOTE — ED Notes (Signed)
X ray in room.

## 2021-01-27 NOTE — Discharge Instructions (Signed)
Your history and exam today are consistent with crush injury to the right ankle and foot.  The x-rays  do not show fracture however clinically I am still concerned about hidden fracture or occult fracture as we discussed.  Thus, we will treat you as if it is broken with a walking boot and crutches.  Please stay off your feet for the next day or 2 and gradually start bearing weight as tolerated.  If the symptoms persist after around a week or so, you may want to follow-up with orthopedist for repeat imaging and reassessment.  Please use the pain medicine to help when the pain is severe and you may use other over-the-counter anti-inflammatory medications, rest, ice, elevation to help with the swelling and discomfort.  If any symptoms change acutely, please return to the nearest emergency department

## 2021-01-27 NOTE — ED Provider Notes (Signed)
[facility] EMERGENCY DEPT Provider Note   CSN: [id number] Arrival date & time: 01/27/21  6415     History Chief Complaint  Patient presents with  . Foot Injury    Sheri Martinez is a 65 y.o. female.  The history is provided by the patient and medical records. No language interpreter was used.  Ankle Pain Location:  Foot and ankle Time since incident:  1 day Injury: yes   Mechanism of injury: crush   Crush:    Mechanism: concrete statue.   Approximate weight of object:  150lbs Ankle location:  R ankle Foot location:  R foot and dorsum of R foot Pain details:    Quality:  Aching and sharp   Radiates to:  Does not radiate   Severity:  Severe   Onset quality:  Sudden   Timing:  Constant   Progression:  Unchanged Chronicity:  New Dislocation: no   Tetanus status:  Unknown Prior injury to area:  No Relieved by:  Nothing Worsened by:  Bearing weight Ineffective treatments:  None tried Associated symptoms: swelling   Associated symptoms: no back pain, no fatigue, no fever, no muscle weakness, no numbness, no stiffness and no tingling        Past Medical History:  Diagnosis Date  . Abdominal pain    right upper quadrant  . Allergic rhinitis   . Angina   . Anxiety   . Bronchitis 10/12/11   "just got over it'  . Chronic gastritis   . Complication of anesthesia   . Dysrhythmia    palpitation  . GERD (gastroesophageal reflux disease)   . Hemorrhoid   . Hiatal hernia   . Hypertension   . Internal hemorrhoids   . Nausea   . Palpitations   . Pneumonia 10/12/11   "just got over it"  . PONV (postoperative nausea and vomiting)   . Shortness of breath 10/12/11   "at any time; can't relate it to anything; here w/chest tightness; just got over pneumonia/bronchitis"    Patient Active Problem List   Diagnosis Date Noted  . Diarrhea, infectious, adult 05/09/2012  . Low BP 05/09/2012  . Hypertension 05/09/2012  . Dehydration, mild 05/09/2012  . Chest  pain at rest 10/12/2011  . MYALGIA 04/04/2010  . CHEST PAIN UNSPECIFIED 12/07/2009  . HEMORRHOIDS 06/16/2009  . NAUSEA 06/16/2009  . ABDOMINAL PAIN, RIGHT UPPER QUADRANT 06/16/2009  . PALPITATIONS 02/02/2009  . HYPERTENSION 03/03/2008  . ALLERGIC RHINITIS 03/03/2008    Past Surgical History:  Procedure Laterality Date  . ABDOMINAL HYSTERECTOMY  ~ 1989   TAH; BSO; due to endometriosis  . CHOLECYSTECTOMY  02/2011  . DILATION AND CURETTAGE OF UTERUS  1980's  . NASAL SEPTUM SURGERY  1990's  . OOPHORECTOMY     (tah/bso) (off estrogen 2005)     OB History   No obstetric history on file.     Family History  Problem Relation Age of Onset  . Hypertension Mother   . Heart disease Other        grandparent  . Ovarian cancer Other        aunt  . Colon cancer Neg Hx     Social History   Tobacco Use  . Smoking status: Former Smoker    Packs/day: 0.50    Years: 3.00    Pack years: 1.50    Types: Cigarettes    Quit date: 12/22/1978    Years since quitting: 42.1  . Smokeless tobacco: Never Used  Substance Use  Topics  . Alcohol use: No  . Drug use: No    Home Medications Prior to Admission medications   Medication Sig Start Date End Date Taking? Authorizing Provider  amLODipine (NORVASC) 5 MG tablet TAKE 1 TABLET DAILY 08/17/20   Baldwin Jamaica, PA-C  aspirin EC 81 MG tablet Take 81 mg by mouth daily.    [provider]  DYMISTA 137-50 MCG/ACT SUSP Place 1 spray into the nose as needed. 02/17/14   [provider]  fexofenadine (ALLEGRA) 60 MG tablet Take 60 mg by mouth daily as needed for allergies.     [provider]  fluticasone (FLONASE) 50 MCG/ACT nasal spray Place 1 spray into both nostrils as needed. 07/05/18   [provider]  ibuprofen (ADVIL,MOTRIN) 800 MG tablet Take 1 tablet (800 mg total) by mouth 3 (three) times daily. 11/11/15   Poynette Lions, PA-C  labetalol (NORMODYNE) 200 MG tablet TAKE 1 TABLET TWICE A DAY 08/17/20    Baldwin Jamaica, PA-C  losartan (COZAAR) 50 MG tablet TAKE 1 TABLET DAILY 08/17/20   Baldwin Jamaica, PA-C  meloxicam (MOBIC) 15 MG tablet Take 1 tablet (15 mg total) by mouth daily. Take 1 daily with food. 11/21/18   Margarita Mail, PA-C  methocarbamol (ROBAXIN) 500 MG tablet Take 1 tablet (500 mg total) by mouth 3 (three) times daily as needed for muscle spasms. 11/21/18   Margarita Mail, PA-C  omeprazole (PRILOSEC) 20 MG capsule Take 20 mg by mouth daily as needed (for acid reflux). For upset stomach    [provider]    Allergies    Penicillins and Sulfamethoxazole  Review of Systems   Review of Systems  Constitutional: Negative for chills, fatigue and fever.  HENT: Negative for congestion.   Respiratory: Negative for cough, chest tightness, shortness of breath and wheezing.   Cardiovascular: Negative for chest pain.  Gastrointestinal: Negative for abdominal pain, constipation, diarrhea, nausea and vomiting.  Genitourinary: Negative for flank pain.  Musculoskeletal: Negative for back pain and stiffness.  Skin: Positive for color change (bruising) and wound (abrasions). Negative for rash.  Neurological: Negative for weakness, numbness and headaches.  Psychiatric/Behavioral: Negative for agitation.  All other systems reviewed and are negative.   Physical Exam Updated Vital Signs BP (!) 173/85 (BP Location: Right Arm)   Pulse 84   Temp 98.1 F (36.7 C) (Oral)   Resp 20   Ht 5\' 4"  (1.626 m)   Wt 70.8 kg   SpO2 100%   BMI 26.78 kg/m   Physical Exam Vitals and nursing note reviewed.  Constitutional:      General: She is not in acute distress.    Appearance: She is well-developed. She is not ill-appearing, toxic-appearing or diaphoretic.  HENT:     Head: Normocephalic and atraumatic.  Eyes:     Conjunctiva/sclera: Conjunctivae normal.  Cardiovascular:     Rate and Rhythm: Normal rate and regular rhythm.     Heart sounds: No murmur heard.   Pulmonary:      Effort: Pulmonary effort is normal. No respiratory distress.     Breath sounds: Normal breath sounds. No wheezing, rhonchi or rales.  Chest:     Chest wall: No tenderness.  Abdominal:     General: Abdomen is flat. There is no distension.  Musculoskeletal:        General: Swelling, tenderness and signs of injury present. No deformity.     Cervical back: Neck supple.     Right lower  leg: No edema.     Left lower leg: No edema.     Right ankle: Tenderness present.     Right foot: Tenderness present.       Legs:     Comments: Bruising, swelling, tenderness, and several abrasions on the right ankle and foot.  Right great toenail is missing at baseline.   Skin:    General: Skin is warm and dry.     Coloration: Skin is not pale.     Findings: Bruising present. No erythema.  Neurological:     General: No focal deficit present.     Mental Status: She is alert.  Psychiatric:        Mood and Affect: Mood normal.     ED Results / Procedures / Treatments   Labs (all labs ordered are listed, but only abnormal results are displayed) Labs Reviewed - No data to display  EKG None  Radiology DG Ankle Complete Right  Result Date: 01/27/2021 CLINICAL DATA:  Right ankle crush injury EXAM: RIGHT ANKLE - COMPLETE 3+ VIEW COMPARISON:  None. FINDINGS: Three view radiograph right ankle demonstrates normal alignment. No acute fracture or dislocation. Ankle mortise is intact. No ankle effusion. Tiny plantar calcaneal spur. Mild degenerative arthritis of the talonavicular articulation. Mild medial soft tissue swelling superficial to the medial malleolus. IMPRESSION: Mild soft tissue swelling.  No fracture or dislocation. Electronically Signed   By: Fidela Salisbury MD   On: 01/27/2021 07:55   DG Foot Complete Right  Result Date: 01/27/2021 CLINICAL DATA:  Right foot crush injury EXAM: RIGHT FOOT COMPLETE - 3+ VIEW COMPARISON:  None. FINDINGS: No acute fracture or dislocation. Normal alignment. Mild  degenerative arthritis of the first MTP joint and talonavicular articulation. Remaining joint spaces are preserved. Mild soft tissue swelling of the right forefoot. Tiny plantar calcaneal spur. IMPRESSION: Mild soft tissue swelling.  No acute fracture or dislocation. Electronically Signed   By: Fidela Salisbury MD   On: 01/27/2021 07:55    Procedures Procedures   Medications Ordered in ED Medications - No data to display  ED Course  I have reviewed the triage vital signs and the nursing notes.  Pertinent labs & imaging results that were available during my care of the patient were reviewed by me and considered in my medical decision making (see chart for details).    MDM Rules/Calculators/A&P                          Sheri Martinez is a 65 y.o. female with a past medical history significant for hypertension who presents with right ankle and foot injury.  Patient reports around 5 PM yesterday, she was trying to move a heavy concrete statue of a woman holding her hand out that tipped over and fell crushing her right ankle and right foot as it fell down.  She reports immediate onset of pain and swelling and some abrasions.  She has had difficulty walking due to the severe pain and did not sleep last night due to the discomfort.  She denies any other injuries including no injury to the knee, shin, or hip.  She reports no preceding symptoms or any illness before this injury.  She reports has been swelling and bruising in both the dorsum of the distal foot and the ankle.  It hurts to walk on it but she denies any numbness or tingling.  On exam, no tenderness in the knee or shin.  She does  have bruising and tenderness of the medial ankle and her dorsum of the foot.  She had intact sensation and cap refill of the toes.  In tact strength of the foot but some pain was present with ankle manipulation and palpation.  Very small abrasions that do not appear to need any laceration repair.  Of note, her right great  toe was lost previously and has an abrasion over but no toenail is present.  Lungs clear and chest nontender, exam otherwise unremarkable.  We will get x-rays to look for fractures of the ankle or foot.  Anticipate reassessment after imaging to determine management and disposition.  Patient was offered pain medicine but she want to wait till the images were completed.     8:09 AM X-ray showed no acute fracture or dislocation.  Clinically I am still concerned about occult fracture.  We will give a walking boot and crutches and give the patient prescription for pain medicine.  She will follow-up with her orthopedist for reassessment if symptoms persist.  She understands symptomatic management instructions with over-the-counter anti-inflammatory medication, pain medicine when it is severe, and elevation/ice.  She had no other questions or concerns and was discharged in good condition.   Final Clinical Impression(s) / ED Diagnoses Final diagnoses:  Crushing injury of right foot and ankle, initial encounter    Rx / DC Orders ED Discharge Orders         Ordered    HYDROcodone-acetaminophen (NORCO/VICODIN) 5-325 MG tablet  Every 4 hours PRN        01/27/21 0804          Clinical Impression: 1. Crushing injury of right foot and ankle, initial encounter     Disposition: Discharge  Condition: Good  I have discussed the results, Dx and Tx plan with the pt(& family if present). He/she/they expressed understanding and agree(s) with the plan. Discharge instructions discussed at great length. Strict return precautions discussed and pt &/or family have verbalized understanding of the instructions. No further questions at time of discharge.    New Prescriptions   HYDROCODONE-ACETAMINOPHEN (NORCO/VICODIN) 5-325 MG TABLET    Take 1 tablet by mouth every 4 (four) hours as needed.    Follow Up: Your orthopedics team if symptoms persist        Janani Chamber, Gwenyth Allegra, MD 01/27/21 [phone]

## 2021-02-03 DIAGNOSIS — M19071 Primary osteoarthritis, right ankle and foot: Secondary | ICD-10-CM | POA: Diagnosis not present

## 2021-02-03 DIAGNOSIS — M25571 Pain in right ankle and joints of right foot: Secondary | ICD-10-CM | POA: Diagnosis not present

## 2021-02-03 DIAGNOSIS — M25771 Osteophyte, right ankle: Secondary | ICD-10-CM | POA: Diagnosis not present

## 2021-02-03 DIAGNOSIS — S92354A Nondisplaced fracture of fifth metatarsal bone, right foot, initial encounter for closed fracture: Secondary | ICD-10-CM | POA: Diagnosis not present

## 2021-02-06 ENCOUNTER — Other Ambulatory Visit: Payer: Self-pay | Admitting: Physician Assistant

## 2021-02-10 ENCOUNTER — Emergency Department (HOSPITAL_BASED_OUTPATIENT_CLINIC_OR_DEPARTMENT_OTHER)
Admission: EM | Admit: 2021-02-10 | Discharge: 2021-02-10 | Disposition: A | Discharge status: Home / Self Care | Payer: BC Managed Care – PPO | Attending: Emergency Medicine | Admitting: Emergency Medicine

## 2021-02-10 ENCOUNTER — Encounter (HOSPITAL_BASED_OUTPATIENT_CLINIC_OR_DEPARTMENT_OTHER): Payer: Self-pay | Admitting: *Deleted

## 2021-02-10 ENCOUNTER — Other Ambulatory Visit: Payer: Self-pay

## 2021-02-10 ENCOUNTER — Emergency Department (HOSPITAL_BASED_OUTPATIENT_CLINIC_OR_DEPARTMENT_OTHER): Payer: BC Managed Care – PPO | Admitting: Radiology

## 2021-02-10 DIAGNOSIS — S20211A Contusion of right front wall of thorax, initial encounter: Secondary | ICD-10-CM | POA: Diagnosis not present

## 2021-02-10 DIAGNOSIS — R079 Chest pain, unspecified: Secondary | ICD-10-CM | POA: Diagnosis not present

## 2021-02-10 DIAGNOSIS — I10 Essential (primary) hypertension: Secondary | ICD-10-CM | POA: Insufficient documentation

## 2021-02-10 DIAGNOSIS — S2001XA Contusion of right breast, initial encounter: Secondary | ICD-10-CM | POA: Insufficient documentation

## 2021-02-10 DIAGNOSIS — Y9301 Activity, walking, marching and hiking: Secondary | ICD-10-CM | POA: Insufficient documentation

## 2021-02-10 DIAGNOSIS — S299XXA Unspecified injury of thorax, initial encounter: Secondary | ICD-10-CM | POA: Diagnosis not present

## 2021-02-10 DIAGNOSIS — R0781 Pleurodynia: Secondary | ICD-10-CM | POA: Diagnosis not present

## 2021-02-10 DIAGNOSIS — Z87891 Personal history of nicotine dependence: Secondary | ICD-10-CM | POA: Insufficient documentation

## 2021-02-10 DIAGNOSIS — W228XXA Striking against or struck by other objects, initial encounter: Secondary | ICD-10-CM | POA: Diagnosis not present

## 2021-02-10 DIAGNOSIS — Z79899 Other long term (current) drug therapy: Secondary | ICD-10-CM | POA: Diagnosis not present

## 2021-02-10 DIAGNOSIS — Z7982 Long term (current) use of aspirin: Secondary | ICD-10-CM | POA: Diagnosis not present

## 2021-02-10 NOTE — ED Provider Notes (Signed)
[facility] EMERGENCY DEPT Provider Note   CSN: [id number] Arrival date & time: 02/10/21  1006     History Chief Complaint  Patient presents with  . Chest Pain    Sheri Martinez is a 65 y.o. female.  65 year old female with past medical history below including hypertension who presents with right chest wall pain.  4 days ago, the patient was walking on a porch when a rocking chair swung back and struck her in the right chest.  Since then, she has had pain in her right chest wall and right breast that is worse with movement and taking a deep breath in.  She has a bruise on her right breast.  She denies any shortness of breath.  No other injuries from the incident, no head injury or loss of consciousness.  No anticoagulant use.  The history is provided by the patient.  Chest Pain      Past Medical History:  Diagnosis Date  . Abdominal pain    right upper quadrant  . Allergic rhinitis   . Angina   . Anxiety   . Bronchitis 10/12/11   "just got over it'  . Chronic gastritis   . Complication of anesthesia   . Dysrhythmia    palpitation  . GERD (gastroesophageal reflux disease)   . Hemorrhoid   . Hiatal hernia   . Hypertension   . Internal hemorrhoids   . Nausea   . Palpitations   . Pneumonia 10/12/11   "just got over it"  . PONV (postoperative nausea and vomiting)   . Shortness of breath 10/12/11   "at any time; can't relate it to anything; here w/chest tightness; just got over pneumonia/bronchitis"    Patient Active Problem List   Diagnosis Date Noted  . Diarrhea, infectious, adult 05/09/2012  . Low BP 05/09/2012  . Hypertension 05/09/2012  . Dehydration, mild 05/09/2012  . Chest pain at rest 10/12/2011  . MYALGIA 04/04/2010  . CHEST PAIN UNSPECIFIED 12/07/2009  . HEMORRHOIDS 06/16/2009  . NAUSEA 06/16/2009  . ABDOMINAL PAIN, RIGHT UPPER QUADRANT 06/16/2009  . PALPITATIONS 02/02/2009  . HYPERTENSION 03/03/2008  . ALLERGIC RHINITIS 03/03/2008     Past Surgical History:  Procedure Laterality Date  . ABDOMINAL HYSTERECTOMY  ~ 1989   TAH; BSO; due to endometriosis  . CHOLECYSTECTOMY  02/2011  . DILATION AND CURETTAGE OF UTERUS  1980's  . NASAL SEPTUM SURGERY  1990's  . OOPHORECTOMY     (tah/bso) (off estrogen 2005)     OB History   No obstetric history on file.     Family History  Problem Relation Age of Onset  . Hypertension Mother   . Heart disease Other        grandparent  . Ovarian cancer Other        aunt  . Colon cancer Neg Hx     Social History   Tobacco Use  . Smoking status: Former Smoker    Packs/day: 0.50    Years: 3.00    Pack years: 1.50    Types: Cigarettes    Quit date: 12/22/1978    Years since quitting: 42.1  . Smokeless tobacco: Never Used  Substance Use Topics  . Alcohol use: No  . Drug use: No    Home Medications Prior to Admission medications   Medication Sig Start Date End Date Taking? Authorizing Provider  amLODipine (NORVASC) 5 MG tablet TAKE 1 TABLET BY MOUTH DAILY 02/06/21  Yes Baldwin Jamaica, PA-C  aspirin EC 81  MG tablet Take 81 mg by mouth daily.   Yes [provider]  labetalol (NORMODYNE) 200 MG tablet TAKE 1 TABLET BY MOUTH TWICE DAILY 02/06/21  Yes Baldwin Jamaica, PA-C  losartan (COZAAR) 50 MG tablet TAKE 1 TABLET BY MOUTH DAILY 02/06/21  Yes Baldwin Jamaica, PA-C  DYMISTA 137-50 MCG/ACT SUSP Place 1 spray into the nose as needed. 02/17/14   [provider]  fexofenadine (ALLEGRA) 60 MG tablet Take 60 mg by mouth daily as needed for allergies.     [provider]  fluticasone (FLONASE) 50 MCG/ACT nasal spray Place 1 spray into both nostrils as needed. 07/05/18   [provider]  HYDROcodone-acetaminophen (NORCO/VICODIN) 5-325 MG tablet Take 1 tablet by mouth every 4 (four) hours as needed. 01/27/21   Tegeler, Gwenyth Allegra, MD  omeprazole (PRILOSEC) 20 MG capsule Take 20 mg by mouth daily as needed (for acid reflux). For upset stomach     [provider]    Allergies    Penicillins and Sulfamethoxazole  Review of Systems   Review of Systems  Cardiovascular: Positive for chest pain.   All other systems reviewed and are negative except that which was mentioned in HPI  Physical Exam Updated Vital Signs BP (!) 179/105   Pulse 90   Temp 98.6 F (37 C) (Oral)   Resp 16   Ht 5\' 4"  (1.626 m)   Wt 70.7 kg   SpO2 100%   BMI 26.76 kg/m   Physical Exam Vitals and nursing note reviewed.  Constitutional:      General: She is not in acute distress.    Appearance: She is well-developed.  HENT:     Head: Normocephalic and atraumatic.  Eyes:     Conjunctiva/sclera: Conjunctivae normal.  Cardiovascular:     Rate and Rhythm: Normal rate and regular rhythm.     Heart sounds: Normal heart sounds.  Pulmonary:     Effort: Pulmonary effort is normal.     Breath sounds: Normal breath sounds.  Chest:     Chest wall: Tenderness present. No crepitus.     Comments: Tenderness of R lateral to posterior chest wall and sternum with no crepitus or skin changes Musculoskeletal:     Cervical back: Neck supple.     Comments: CAM walker on R lower leg  Skin:    General: Skin is warm and dry.     Findings: Ecchymosis present.     Comments: Ecchymosis R lateral breast  Neurological:     Mental Status: She is alert and oriented to person, place, and time.  Psychiatric:        Judgment: Judgment normal.   chaperone present  ED Results / Procedures / Treatments   Labs (all labs ordered are listed, but only abnormal results are displayed) Labs Reviewed - No data to display  EKG EKG Interpretation  Date/Time:  Friday February 10 2021 10:19:41 EDT Ventricular Rate:  74 PR Interval:  137 QRS Duration: 90 QT Interval:  392 QTC Calculation: 435 R Axis:   70 Text Interpretation: Sinus rhythm Borderline T abnormalities, anterior leads No significant change since last tracing Confirmed by Theotis Burrow [phone]) on 02/10/2021  10:25:39 AM   Radiology DG Ribs Unilateral W/Chest Right  Result Date: 02/10/2021 CLINICAL DATA:  Pain after heavy object fell on chest EXAM: RIGHT RIBS AND CHEST - 3+ VIEW COMPARISON:  None. FINDINGS: Frontal chest as well as oblique and cone-down right rib images were obtained. Lungs are clear. Heart  size and pulmonary vascularity are normal. No adenopathy. No pneumothorax or pleural effusion. No demonstrable rib fracture. IMPRESSION: No evident rib fracture.  Lungs clear. Electronically Signed   By: Lowella Grip III M.D.   On: 02/10/2021 11:04    Procedures Procedures   Medications Ordered in ED Medications - No data to display  ED Course  I have reviewed the triage vital signs and the nursing notes.  Pertinent imaging results that were available during my care of the patient were reviewed by me and considered in my medical decision making (see chart for details).    MDM Rules/Calculators/A&P                          Pt w/ bruising on R breast, chest wall tender to palpation which reproduced her pain. EKG normal. Sx directly related to trauma therefore highly doubt PE or ACS.  Rib x-rays show no rib fractures and clear lungs.  Given that she is 4 days out from her initial injury with reassuring vital signs, normal O2 saturations, and clear lungs, I do not feel she needs advanced imaging at this time.  I have discussed the possibility of occult rib fracture versus chest wall contusion and have discussed supportive measures including scheduled Tylenol and ibuprofen as needed for pain and monitoring breathing.  I have discussed PCP follow-up if her symptoms are persistent and I have reviewed return precautions.  Patient voiced understanding. Final Clinical Impression(s) / ED Diagnoses Final diagnoses:  Chest wall contusion, right, initial encounter  Contusion of right breast, initial encounter    Rx / DC Orders ED Discharge Orders    None       Dorine Duffey, Wenda Overland,  MD 02/10/21 1200

## 2021-02-10 NOTE — ED Triage Notes (Addendum)
Patient complaint of left chest pain when the rocker fell on her chest 4 days ago .  Purplish discoloration to right breast noted.

## 2021-02-10 NOTE — Discharge Instructions (Addendum)
Take 2 extra strength Tylenol by mouth every 6-8 hours for pain.  You may take 400 mg ibuprofen occasionally if needed for breakthrough pain. Return to ER if any worsening symptoms or breathing problems.

## 2021-02-24 DIAGNOSIS — S92354D Nondisplaced fracture of fifth metatarsal bone, right foot, subsequent encounter for fracture with routine healing: Secondary | ICD-10-CM | POA: Diagnosis not present

## 2021-04-06 DIAGNOSIS — U071 COVID-19: Secondary | ICD-10-CM | POA: Diagnosis not present

## 2021-04-27 DIAGNOSIS — J0141 Acute recurrent pansinusitis: Secondary | ICD-10-CM | POA: Diagnosis not present

## 2021-05-02 DIAGNOSIS — Z1231 Encounter for screening mammogram for malignant neoplasm of breast: Secondary | ICD-10-CM | POA: Diagnosis not present

## 2021-05-10 DIAGNOSIS — Z1152 Encounter for screening for COVID-19: Secondary | ICD-10-CM | POA: Diagnosis not present

## 2021-05-10 DIAGNOSIS — J329 Chronic sinusitis, unspecified: Secondary | ICD-10-CM | POA: Diagnosis not present

## 2021-06-14 ENCOUNTER — Encounter: Payer: Self-pay | Admitting: Family Medicine

## 2021-06-14 ENCOUNTER — Other Ambulatory Visit: Payer: Self-pay

## 2021-06-14 ENCOUNTER — Ambulatory Visit: Payer: BC Managed Care – PPO | Admitting: Family Medicine

## 2021-06-14 ENCOUNTER — Encounter: Payer: Self-pay | Admitting: Physician Assistant

## 2021-06-14 VITALS — BP 136/74 | HR 71 | Temp 98.2°F | Resp 17 | Ht 64.0 in | Wt 150.4 lb

## 2021-06-14 DIAGNOSIS — I10 Essential (primary) hypertension: Secondary | ICD-10-CM | POA: Diagnosis not present

## 2021-06-14 DIAGNOSIS — R413 Other amnesia: Secondary | ICD-10-CM | POA: Diagnosis not present

## 2021-06-14 DIAGNOSIS — Z13220 Encounter for screening for lipoid disorders: Secondary | ICD-10-CM | POA: Diagnosis not present

## 2021-06-14 LAB — COMPREHENSIVE METABOLIC PANEL
ALT: 16 U/L (ref 0–35)
AST: 19 U/L (ref 0–37)
Albumin: 4.8 g/dL (ref 3.5–5.2)
Alkaline Phosphatase: 64 U/L (ref 39–117)
BUN: 24 mg/dL — ABNORMAL HIGH (ref 6–23)
CO2: 26 mEq/L (ref 19–32)
Calcium: 9.8 mg/dL (ref 8.4–10.5)
Chloride: 104 mEq/L (ref 96–112)
Creatinine, Ser: 0.97 mg/dL (ref 0.40–1.20)
GFR: 60.63 mL/min (ref >60.00)
Glucose, Bld: 94 mg/dL (ref 70–99)
Potassium: 4 mEq/L (ref 3.5–5.1)
Sodium: 140 mEq/L (ref 135–145)
Total Bilirubin: 0.9 mg/dL (ref 0.2–1.2)
Total Protein: 7.4 g/dL (ref 6.0–8.3)

## 2021-06-14 LAB — LIPID PANEL
Cholesterol: 231 mg/dL — ABNORMAL HIGH (ref 0–200)
HDL: 54.8 mg/dL (ref >39.00)
LDL Cholesterol: 156 mg/dL — ABNORMAL HIGH (ref 0–99)
NonHDL: 175.96
Total CHOL/HDL Ratio: 4
Triglycerides: 98 mg/dL (ref 0.0–149.0)
VLDL: 19.6 mg/dL (ref 0.0–40.0)

## 2021-06-14 LAB — VITAMIN B12: Vitamin B-12: 273 pg/mL (ref 211–911)

## 2021-06-14 LAB — TSH: TSH: 1.27 u[IU]/mL (ref 0.35–5.50)
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
## 2021-06-14 NOTE — Patient Instructions (Addendum)
Nice meeting you today.  I will check some tests for your blood pressure, screen for cholesterol, as well as screen for reversible causes of memory loss.  I did refer you to a memory specialist/neurologist to discuss your symptoms further and possible medications or treatment.  They may also discuss neurocognitive testing or other testing to evaluate different areas of memory.  For now I recommend making lists, continue crosswords, sudoku, or other activities to keep your brain active.  Exercise with low intensity exercise such as walking most days per week can be helpful.  See other information below.  Follow-up with me in 3 months, sooner if any new or worsening symptoms.  Memory Compensation Strategies  Use "WARM" strategy.  W= write it down  A= associate it  R= repeat it  M= make a mental note  2.   You can keep a Social worker.  Use a 3-ring notebook with sections for the following: calendar, important names and phone numbers,  medications, doctors' names/phone numbers, lists/reminders, and a section to journal what you did  each day.   3.    Use a calendar to write appointments down.  4.    Write yourself a schedule for the day.  This can be placed on the calendar or in a separate section of the Memory Notebook.  Keeping a  regular schedule can help memory.  5.    Use medication organizer with sections for each day or morning/evening pills.  You may need help loading it  6.    Keep a basket, or pegboard by the door.  Place items that you need to take out with you in the basket or on the pegboard.  You may also want to  include a message board for reminders.  7.    Use sticky notes.  Place sticky notes with reminders in a place where the task is performed.  For example: " turn off the  stove" placed by the stove, "lock the door" placed on the door at eye level, " take your medications" on  the bathroom mirror or by the place where you normally take your medications.  8.    Use  alarms/timers.  Use while cooking to remind yourself to check on food or as a reminder to take your medicine, or as a  reminder to make a call, or as a reminder to perform another task, etc.
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
## 2021-07-03 ENCOUNTER — Encounter: Payer: Self-pay | Admitting: Physician Assistant

## 2021-07-03 ENCOUNTER — Telehealth: Payer: Self-pay | Admitting: Physician Assistant

## 2021-07-03 ENCOUNTER — Other Ambulatory Visit: Payer: Self-pay

## 2021-07-03 ENCOUNTER — Ambulatory Visit: Payer: BC Managed Care – PPO | Admitting: Physician Assistant

## 2021-07-03 VITALS — BP 196/82 | HR 71 | Resp 18 | Ht 64.0 in | Wt 150.0 lb

## 2021-07-03 DIAGNOSIS — R413 Other amnesia: Secondary | ICD-10-CM | POA: Diagnosis not present

## 2021-07-03 DIAGNOSIS — G3184 Mild cognitive impairment, so stated: Secondary | ICD-10-CM

## 2021-07-03 MED ORDER — DONEPEZIL HCL 10 MG PO TABS: ORAL_TABLET | ORAL | 11 refills | Status: DC

## 2021-07-03 NOTE — Telephone Encounter (Signed)
I contacted husband back voiced understanding

## 2021-07-03 NOTE — Patient Instructions (Addendum)
It was a pleasure to see you today at our office.   Recommendations:  Neurocognitive evaluation at our office MRI of the brain, the radiology office will call you to arrange you appointment  Start Donepezil :Take half tablet (5 mg) daily for 2 weeks, then increase to the full tablet at 10 mg daily   Follow up once the results of the above are available   RECOMMENDATIONS FOR ALL PATIENTS WITH MEMORY PROBLEMS: 1. Continue to exercise (Recommend 30 minutes of walking everyday, or 3 hours every week) 2. Increase social interactions - continue going to [place] and enjoy social gatherings with friends and family 3. Eat healthy, avoid fried foods and eat more fruits and vegetables 4. Maintain adequate blood pressure, blood sugar, and blood cholesterol level. Reducing the risk of stroke and cardiovascular disease also helps promoting better memory. 5. Avoid stressful situations. Live a simple life and avoid aggravations. Organize your time and prepare for the next day in anticipation. 6. Sleep well, avoid any interruptions of sleep and avoid any distractions in the bedroom that may interfere with adequate sleep quality 7. Avoid sugar, avoid sweets as there is a strong link between excessive sugar intake, diabetes, and cognitive impairment We discussed the Mediterranean diet, which has been shown to help patients reduce the risk of progressive memory disorders and reduces cardiovascular risk. This includes eating fish, eat fruits and green leafy vegetables, nuts like almonds and hazelnuts, walnuts, and also use olive oil. Avoid fast foods and fried foods as much as possible. Avoid sweets and sugar as sugar use has been linked to worsening of memory function.  There is always a concern of gradual progression of memory problems. If this is the case, then we may need to adjust level of care according to patient needs. Support, both to the patient and caregiver, should then be put into place.      You have  been referred for a neuropsychological evaluation (i.e., evaluation of memory and thinking abilities). Please bring someone with you to this appointment if possible, as it is helpful for the doctor to hear from both you and another adult who knows you well. Please bring eyeglasses and hearing aids if you wear them.    The evaluation will take approximately 3 hours and has two parts:   The first part is a clinical interview with the neuropsychologist (Dr. Melvyn Novas or Dr. Nicole Kindred). During the interview, the neuropsychologist will speak with you and the individual you brought to the appointment.    The second part of the evaluation is testing with the doctor's technician Hinton Dyer or Maudie Mercury). During the testing, the technician will ask you to remember different types of material, solve problems, and answer some questionnaires. Your family member will not be present for this portion of the evaluation.   Please note: We must reserve several hours of the neuropsychologist's time and the psychometrician's time for your evaluation appointment. As such, there is a No-Show fee of $100. If you are unable to attend any of your appointments, please contact our office as soon as possible to reschedule.    FALL PRECAUTIONS: Be cautious when walking. Scan the area for obstacles that may increase the risk of trips and falls. When getting up in the mornings, sit up at the edge of the bed for a few minutes before getting out of bed. Consider elevating the bed at the head end to avoid drop of blood pressure when getting up. Walk always in a well-lit room (use night  lights in the walls). Avoid area rugs or power cords from appliances in the middle of the walkways. Use a walker or a cane if necessary and consider physical therapy for balance exercise. Get your eyesight checked regularly.  FINANCIAL OVERSIGHT: Supervision, especially oversight when making financial decisions or transactions is also recommended.  HOME SAFETY: Consider  the safety of the kitchen when operating appliances like stoves, microwave oven, and blender. Consider having supervision and share cooking responsibilities until no longer able to participate in those. Accidents with firearms and other hazards in the house should be identified and addressed as well.   ABILITY TO BE LEFT ALONE: If patient is unable to contact 911 operator, consider using LifeLine, or when the need is there, arrange for someone to stay with patients. Smoking is a fire hazard, consider supervision or cessation. Risk of wandering should be assessed by caregiver and if detected at any point, supervision and safe proof recommendations should be instituted.  MEDICATION SUPERVISION: Inability to self-administer medication needs to be constantly addressed. Implement a mechanism to ensure safe administration of the medications.   DRIVING: Regarding driving, in patients with progressive memory problems, driving will be impaired. We advise to have someone else do the driving if trouble finding directions or if minor accidents are reported. Independent driving assessment is available to determine safety of driving.   If you are interested in the driving assessment, you can contact the following:  [employer] in [place]  [place] [phone] or [phone]    [place] refers to food and lifestyle choices that are based on the traditions of countries located on the [employer]. This way of eating has been shown to help prevent certain conditions and improve outcomes for people who have chronic diseases, like kidney disease and heart disease. What are tips for following this plan? Lifestyle  Cook and eat meals together with your family, when possible. Drink enough fluid to keep your urine clear or pale yellow. Be physically active  every day. This includes: Aerobic exercise like running or swimming. Leisure activities like gardening, walking, or housework. Get 7-8 hours of sleep each night. If recommended by your health care provider, drink red wine in moderation. This means 1 glass a day for nonpregnant women and 2 glasses a day for men. A glass of wine equals 5 oz (150 mL). Reading food labels  Check the serving size of packaged foods. For foods such as rice and pasta, the serving size refers to the amount of cooked product, not dry. Check the total fat in packaged foods. Avoid foods that have saturated fat or trans fats. Check the ingredients list for added sugars, such as corn syrup. Shopping  At the grocery store, buy most of your food from the areas near the walls of the store. This includes: Fresh fruits and vegetables (produce). Grains, beans, nuts, and seeds. Some of these may be available in unpackaged forms or large amounts (in bulk). Fresh seafood. Poultry and eggs. Low-fat dairy products. Buy whole ingredients instead of prepackaged foods. Buy fresh fruits and vegetables in-season from local farmers markets. Buy frozen fruits and vegetables in resealable bags. If you do not have access to quality fresh seafood, buy precooked frozen shrimp or canned fish, such as tuna, salmon, or sardines. Buy small amounts of raw or cooked vegetables, salads, or olives from the deli or salad bar at your store. Stock your  pantry so you always have certain foods on hand, such as olive oil, canned tuna, canned tomatoes, rice, pasta, and beans. Cooking  Cook foods with extra-virgin olive oil instead of using butter or other vegetable oils. Have meat as a side dish, and have vegetables or grains as your main dish. This means having meat in small portions or adding small amounts of meat to foods like pasta or stew. Use beans or vegetables instead of meat in common dishes like chili or lasagna. Experiment with different cooking  methods. Try roasting or broiling vegetables instead of steaming or sauteing them. Add frozen vegetables to soups, stews, pasta, or rice. Add nuts or seeds for added healthy fat at each meal. You can add these to yogurt, salads, or vegetable dishes. Marinate fish or vegetables using olive oil, lemon juice, garlic, and fresh herbs. Meal planning  Plan to eat 1 vegetarian meal one day each week. Try to work up to 2 vegetarian meals, if possible. Eat seafood 2 or more times a week. Have healthy snacks readily available, such as: Vegetable sticks with hummus. Greek yogurt. Fruit and nut trail mix. Eat balanced meals throughout the week. This includes: Fruit: 2-3 servings a day Vegetables: 4-5 servings a day Low-fat dairy: 2 servings a day Fish, poultry, or lean meat: 1 serving a day Beans and legumes: 2 or more servings a week Nuts and seeds: 1-2 servings a day Whole grains: 6-8 servings a day Extra-virgin olive oil: 3-4 servings a day Limit red meat and sweets to only a few servings a month What are my food choices? Mediterranean diet Recommended Grains: Whole-grain pasta. Brown rice. Bulgar wheat. Polenta. Couscous. Whole-wheat bread. [place]. Vegetables: Artichokes. Beets. Broccoli. Cabbage. Carrots. Eggplant. Green beans. Chard. Kale. Spinach. Onions. Leeks. Peas. Squash. Tomatoes. Peppers. Radishes. Fruits: Apples. Apricots. Avocado. Berries. Bananas. Cherries. Dates. Figs. Grapes. Lemons. Melon. Oranges. Peaches. Plums. Pomegranate. Meats and other protein foods: Beans. Almonds. Sunflower seeds. Pine nuts. Peanuts. [place]. Salmon. Scallops. Shrimp. [place]. Tilapia. Clams. Oysters. Eggs. Dairy: Low-fat milk. Cheese. Greek yogurt. Beverages: Water. Red wine. Herbal tea. Fats and oils: Extra virgin olive oil. Avocado oil. Grape seed oil. Sweets and desserts: [place] yogurt with honey. Baked apples. Poached pears. Trail mix. Seasoning and other foods: Basil. Cilantro. Coriander.  Cumin. Mint. Parsley. Sage. Rosemary. Tarragon. Garlic. Oregano. Thyme. Pepper. Balsalmic vinegar. Tahini. Hummus. Tomato sauce. Olives. Mushrooms. Limit these Grains: Prepackaged pasta or rice dishes. Prepackaged cereal with added sugar. Vegetables: Deep fried potatoes (french fries). Fruits: Fruit canned in syrup. Meats and other protein foods: Beef. Pork. Lamb. Poultry with skin. Hot dogs. [place] Salines. Dairy: Ice cream. Sour cream. Whole milk. Beverages: Juice. Sugar-sweetened soft drinks. Beer. Liquor and spirits. Fats and oils: Butter. Canola oil. Vegetable oil. Beef fat (tallow). Lard. Sweets and desserts: Cookies. Cakes. Pies. Candy. Seasoning and other foods: Mayonnaise. Premade sauces and marinades. The items listed may not be a complete list. Talk with your dietitian about what dietary choices are right for you. Summary The Mediterranean diet includes both food and lifestyle choices. Eat a variety of fresh fruits and vegetables, beans, nuts, seeds, and whole grains. Limit the amount of red meat and sweets that you eat. Talk with your health care provider about whether it is safe for you to drink red wine in moderation. This means 1 glass a day for nonpregnant women and 2 glasses a day for men. A glass of wine equals 5 oz (150 mL). This information is not intended to replace advice given to  you by your health care provider. Make sure you discuss any questions you have with your health care provider. Document Released: 05/24/2016 Document Revised: 06/26/2016 Document Reviewed: 05/24/2016 Elsevier Interactive Patient Education  2017 [employer].

## 2021-07-03 NOTE — Telephone Encounter (Signed)
Pt husband called, he would like to speak with someone about todays visit. You can call him at [phone].

## 2021-07-05 ENCOUNTER — Ambulatory Visit
Admission: RE | Admit: 2021-07-05 | Discharge: 2021-07-05 | Disposition: A | Discharge status: Home / Self Care | Payer: BC Managed Care – PPO | Source: Ambulatory Visit | Attending: Physician Assistant | Admitting: Physician Assistant

## 2021-07-05 DIAGNOSIS — R413 Other amnesia: Secondary | ICD-10-CM | POA: Diagnosis not present

## 2021-07-11 ENCOUNTER — Encounter: Payer: Self-pay | Admitting: Family

## 2021-07-11 ENCOUNTER — Other Ambulatory Visit: Payer: Self-pay

## 2021-07-11 ENCOUNTER — Telehealth (INDEPENDENT_AMBULATORY_CARE_PROVIDER_SITE_OTHER): Payer: BC Managed Care – PPO | Admitting: Family

## 2021-07-11 VITALS — Ht 64.0 in | Wt 135.0 lb

## 2021-07-11 DIAGNOSIS — J019 Acute sinusitis, unspecified: Secondary | ICD-10-CM | POA: Diagnosis not present

## 2021-07-11 MED ORDER — DOXYCYCLINE HYCLATE 100 MG PO TABS: 100.0000 mg | ORAL_TABLET | Freq: Two times a day (BID) | ORAL | 0 refills | Status: DC

## 2021-07-11 NOTE — Progress Notes (Signed)
Voicemail, call back

## 2021-07-11 NOTE — Progress Notes (Signed)
Alvita Fana is a 65 y.o. female with the following history as recorded in EpicCare:  Patient Active Problem List   Diagnosis Date Noted   Mild neurocognitive disorder 07/03/2021   Diarrhea, infectious, adult 05/09/2012   Low BP 05/09/2012   Hypertension 05/09/2012   Dehydration, mild 05/09/2012   Chest pain at rest 10/12/2011   MYALGIA 04/04/2010   CHEST PAIN UNSPECIFIED 12/07/2009   HEMORRHOIDS 06/16/2009   NAUSEA 06/16/2009   ABDOMINAL PAIN, RIGHT UPPER QUADRANT 06/16/2009   PALPITATIONS 02/02/2009   HYPERTENSION 03/03/2008   ALLERGIC RHINITIS 03/03/2008    Current Outpatient Medications  Medication Sig Dispense Refill   amLODipine (NORVASC) 5 MG tablet TAKE 1 TABLET BY MOUTH DAILY 90 tablet 4   aspirin EC 81 MG tablet Take 81 mg by mouth daily.     doxycycline (VIBRA-TABS) 100 MG tablet Take 1 tablet (100 mg total) by mouth 2 (two) times daily. 14 tablet 0   fluticasone (FLONASE) 50 MCG/ACT nasal spray Place 1 spray into both nostrils as needed.     labetalol (NORMODYNE) 200 MG tablet TAKE 1 TABLET BY MOUTH TWICE DAILY 180 tablet 4   losartan (COZAAR) 50 MG tablet TAKE 1 TABLET BY MOUTH DAILY 90 tablet 4   donepezil (ARICEPT) 10 MG tablet Take half tablet (5 mg) daily for 2 weeks, then increase to the full tablet at 10 mg daily (Patient not taking: Reported on 07/11/2021) 30 tablet 11   No current facility-administered medications for this visit.    Allergies: Penicillins and Sulfamethoxazole  Past Medical History:  Diagnosis Date   Abdominal pain    right upper quadrant   Allergic rhinitis    Angina    Anxiety    Bronchitis 10/12/11   "just got over it'   Chronic gastritis    Complication of anesthesia    Dysrhythmia    palpitation   GERD (gastroesophageal reflux disease)    Hemorrhoid    Hiatal hernia    Hypertension    Internal hemorrhoids    Nausea    Palpitations    Pneumonia 10/12/11   "just got over it"   PONV (postoperative nausea and vomiting)     Shortness of breath 10/12/11   "at any time; can't relate it to anything; here w/chest tightness; just got over pneumonia/bronchitis"    Past Surgical History:  Procedure Laterality Date   ABDOMINAL HYSTERECTOMY  ~ 1989   TAH; BSO; due to endometriosis   CHOLECYSTECTOMY  02/2011   DILATION AND CURETTAGE OF UTERUS  1980's   NASAL SEPTUM SURGERY  1990's   OOPHORECTOMY     (tah/bso) (off estrogen 2005)    Family History  Problem Relation Age of Onset   Hypertension Mother    Heart disease Other        grandparent   Ovarian cancer Other        aunt   Colon cancer Neg Hx     Social History   Tobacco Use   Smoking status: Former    Packs/day: 0.50    Years: 3.00    Pack years: 1.50    Types: Cigarettes    Quit date: 12/22/1978    Years since quitting: 42.5   Smokeless tobacco: Never  Substance Use Topics   Alcohol use: No    Subjective:    I connected with Kynslei Mansour on 07/11/21 at  3:00 PM EDT by a telephone call and verified that I am speaking with the correct person using two identifiers.  I discussed the limitations of evaluation and management by telemedicine and the availability of in person appointments. The patient expressed understanding and agreed to proceed. Provider in office/ patient is at home; provider and patient are only 2 people on telephone call.   Concerned for sinus infection; notes symptoms present x 1 week and is prone to recurrent sinus infections; + sinus pain/ pressure; + drainage; has not taken COVID test- notes this feels "like my normal sinus infection.    Objective:  Vitals:   07/11/21 1424  Weight: 135 lb (61.2 kg)  Height: 5\' 4"  (1.626 m)    Lungs: Respirations unlabored;  Neurologic: Alert and oriented; speech intact;   Assessment:  1. Acute sinusitis, recurrence not specified, unspecified location     Plan:  Rx for Doxycycline 100 mg bid x 7 days; she is encouraged to take a COVID test if symptoms persist; she is also encouraged  to see her PCP in person if symptoms persist.  Time spent 10 minutes  No follow-ups on file.  No orders of the defined types were placed in this encounter.   Requested Prescriptions   Signed Prescriptions Disp Refills   doxycycline (VIBRA-TABS) 100 MG tablet 14 tablet 0    Sig: Take 1 tablet (100 mg total) by mouth 2 (two) times daily.

## 2021-09-13 ENCOUNTER — Ambulatory Visit: Payer: BC Managed Care – PPO | Admitting: Family Medicine

## 2021-11-08 ENCOUNTER — Other Ambulatory Visit: Payer: Self-pay

## 2021-11-08 ENCOUNTER — Encounter: Payer: Self-pay | Admitting: Psychology

## 2021-11-08 ENCOUNTER — Ambulatory Visit (INDEPENDENT_AMBULATORY_CARE_PROVIDER_SITE_OTHER): Payer: BC Managed Care – PPO | Admitting: Psychology

## 2021-11-08 ENCOUNTER — Ambulatory Visit: Payer: BC Managed Care – PPO | Admitting: Psychology

## 2021-11-08 DIAGNOSIS — F0280 Dementia in other diseases classified elsewhere without behavioral disturbance: Secondary | ICD-10-CM

## 2021-11-08 DIAGNOSIS — I499 Cardiac arrhythmia, unspecified: Secondary | ICD-10-CM | POA: Insufficient documentation

## 2021-11-08 DIAGNOSIS — S060X0S Concussion without loss of consciousness, sequela: Secondary | ICD-10-CM

## 2021-11-08 DIAGNOSIS — G309 Alzheimer's disease, unspecified: Secondary | ICD-10-CM | POA: Diagnosis not present

## 2021-11-08 DIAGNOSIS — F411 Generalized anxiety disorder: Secondary | ICD-10-CM | POA: Insufficient documentation

## 2021-11-08 DIAGNOSIS — K219 Gastro-esophageal reflux disease without esophagitis: Secondary | ICD-10-CM | POA: Insufficient documentation

## 2021-11-08 DIAGNOSIS — K2950 Unspecified chronic gastritis without bleeding: Secondary | ICD-10-CM | POA: Insufficient documentation

## 2021-11-08 DIAGNOSIS — R4189 Other symptoms and signs involving cognitive functions and awareness: Secondary | ICD-10-CM

## 2021-11-08 NOTE — Progress Notes (Signed)
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 Block Design: 8 25 Average       Mood and Personality:      Raw Score Percentile   Beck Depression Inventory - II: 0 --- Within Normal Limits  PROMIS Anxiety Questionnaire: 17 --- Mild       Additional Questionnaires:      Raw Score Percentile   PROMIS Sleep Disturbance Questionnaire: 20 --- None to Slight   Informed Consent and Coding/Compliance:   The current evaluation represents a clinical evaluation for the purposes previously outlined by the referral source and is in no way reflective of a forensic evaluation.   Ms. Milholland was provided with a verbal description of the nature and purpose of the present neuropsychological evaluation. Also reviewed were the foreseeable risks and/or discomforts and benefits of the procedure, limits of confidentiality, and mandatory reporting requirements of this provider. The patient was given the opportunity to ask questions and receive answers about the evaluation. Oral consent to participate was provided by the patient.   This evaluation was  conducted by Christia Reading, Ph.D., ABPP-CN, board certified clinical neuropsychologist. Ms. Belenda Cruise completed a clinical interview with Dr. Melvyn Novas, billed as one unit [phone], and 115 minutes of cognitive testing and scoring, billed as one unit [phone] and three additional units [id number]. Psychometrist Milana Kidney, B.S., assisted Dr. Melvyn Novas with test administration and scoring procedures. As a separate and discrete service, Dr. Melvyn Novas spent a total of 160 minutes in interpretation and report writing billed as one unit [phone] and two units 96133.

## 2021-11-08 NOTE — Progress Notes (Signed)
° °  Psychometrician Note   Cognitive testing was administered to Sheri Martinez by Sheri Martinez, B.S. (psychometrist) under the supervision of Dr. Christia Martinez, Ph.D., licensed psychologist on 11/08/21. Sheri Martinez did not appear overtly distressed by the testing session per behavioral observation or responses across self-report questionnaires. Rest breaks were offered.    The battery of tests administered was selected by Dr. Christia Martinez, Ph.D. with consideration to Sheri Martinez's current level of functioning, the nature of her symptoms, emotional and behavioral responses during interview, level of literacy, observed level of motivation/effort, and the nature of the referral question. This battery was communicated to the psychometrist. Communication between Dr. Christia Martinez, Ph.D. and the psychometrist was ongoing throughout the evaluation and Dr. Christia Martinez, Ph.D. was immediately accessible at all times. Dr. Christia Martinez, Ph.D. provided supervision to the psychometrist on the date of this service to the extent necessary to assure the quality of all services provided.    Sheri Martinez will return within approximately 1-2 weeks for an interactive feedback session with Dr. Melvyn Martinez at which time her test performances, clinical impressions, and treatment recommendations will be reviewed in detail. Sheri Martinez understands she can contact our office should she require our assistance before this time.  A total of 115 minutes of billable time were spent face-to-face with Sheri Martinez by the psychometrist. This includes both test administration and scoring time. Billing for these services is reflected in the clinical report generated by Dr. Christia Martinez, Ph.D.  This note reflects time spent with the psychometrician and does not include test scores or any clinical interpretations made by Dr. Melvyn Martinez. The full report will follow in a separate note.

## 2021-11-09 ENCOUNTER — Encounter: Payer: Self-pay | Admitting: Psychology

## 2021-11-09 DIAGNOSIS — F0280 Dementia in other diseases classified elsewhere without behavioral disturbance: Secondary | ICD-10-CM

## 2021-11-09 DIAGNOSIS — G309 Alzheimer's disease, unspecified: Secondary | ICD-10-CM | POA: Insufficient documentation

## 2021-11-09 HISTORY — DX: Dementia in other diseases classified elsewhere, unspecified severity, without behavioral disturbance, psychotic disturbance, mood disturbance, and anxiety: F02.80

## 2021-11-15 ENCOUNTER — Other Ambulatory Visit: Payer: Self-pay

## 2021-11-15 ENCOUNTER — Ambulatory Visit (INDEPENDENT_AMBULATORY_CARE_PROVIDER_SITE_OTHER): Payer: BC Managed Care – PPO | Admitting: Psychology

## 2021-11-15 DIAGNOSIS — G309 Alzheimer's disease, unspecified: Secondary | ICD-10-CM

## 2021-11-15 DIAGNOSIS — S060X0S Concussion without loss of consciousness, sequela: Secondary | ICD-10-CM | POA: Diagnosis not present

## 2021-11-15 DIAGNOSIS — F0280 Dementia in other diseases classified elsewhere without behavioral disturbance: Secondary | ICD-10-CM
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
## 2021-11-21 ENCOUNTER — Encounter: Payer: BC Managed Care – PPO | Admitting: Psychology

## 2021-12-05 ENCOUNTER — Encounter: Payer: BC Managed Care – PPO | Admitting: Psychology

## 2021-12-13 ENCOUNTER — Ambulatory Visit: Payer: BC Managed Care – PPO | Admitting: Physician Assistant

## 2021-12-18 ENCOUNTER — Other Ambulatory Visit: Payer: Self-pay

## 2021-12-18 ENCOUNTER — Ambulatory Visit (INDEPENDENT_AMBULATORY_CARE_PROVIDER_SITE_OTHER): Payer: Medicare Other | Admitting: Physician Assistant

## 2021-12-18 ENCOUNTER — Encounter: Payer: Self-pay | Admitting: Physician Assistant

## 2021-12-18 VITALS — BP 172/86 | Resp 18 | Ht 64.0 in | Wt 147.0 lb

## 2021-12-18 DIAGNOSIS — F0280 Dementia in other diseases classified elsewhere without behavioral disturbance: Secondary | ICD-10-CM | POA: Diagnosis not present

## 2021-12-18 DIAGNOSIS — G309 Alzheimer's disease, unspecified: Secondary | ICD-10-CM | POA: Diagnosis not present

## 2021-12-18 MED ORDER — DONEPEZIL HCL 10 MG PO TABS: ORAL_TABLET | ORAL | 11 refills | Status: DC

## 2021-12-18 NOTE — Patient Instructions (Signed)
.It was a pleasure to see you today at our office.  ? ?Recommendations: ? ?Follow up in 6  months ?Continue donepezil 10 mg daily.   ? ? ?RECOMMENDATIONS FOR ALL PATIENTS WITH MEMORY PROBLEMS: ?1. Continue to exercise (Recommend 30 minutes of walking everyday, or 3 hours every week) ?2. Increase social interactions - continue going to [place] and enjoy social gatherings with friends and family ?3. Eat healthy, avoid fried foods and eat more fruits and vegetables ?4. Maintain adequate blood pressure, blood sugar, and blood cholesterol level. Reducing the risk of stroke and cardiovascular disease also helps promoting better memory. ?5. Avoid stressful situations. Live a simple life and avoid aggravations. Organize your time and prepare for the next day in anticipation. ?6. Sleep well, avoid any interruptions of sleep and avoid any distractions in the bedroom that may interfere with adequate sleep quality ?7. Avoid sugar, avoid sweets as there is a strong link between excessive sugar intake, diabetes, and cognitive impairment ?We discussed the Mediterranean diet, which has been shown to help patients reduce the risk of progressive memory disorders and reduces cardiovascular risk. This includes eating fish, eat fruits and green leafy vegetables, nuts like almonds and hazelnuts, walnuts, and also use olive oil. Avoid fast foods and fried foods as much as possible. Avoid sweets and sugar as sugar use has been linked to worsening of memory function. ? ?There is always a concern of gradual progression of memory problems. If this is the case, then we may need to adjust level of care according to patient needs. Support, both to the patient and caregiver, should then be put into place.  ? ? ?FALL PRECAUTIONS: Be cautious when walking. Scan the area for obstacles that may increase the risk of trips and falls. When getting up in the mornings, sit up at the edge of the bed for a few minutes before getting out of bed. Consider  elevating the bed at the head end to avoid drop of blood pressure when getting up. Walk always in a well-lit room (use night lights in the walls). Avoid area rugs or power cords from appliances in the middle of the walkways. Use a walker or a cane if necessary and consider physical therapy for balance exercise. Get your eyesight checked regularly. ? ?FINANCIAL OVERSIGHT: Supervision, especially oversight when making financial decisions or transactions is also recommended. ? ?HOME SAFETY: Consider the safety of the kitchen when operating appliances like stoves, microwave oven, and blender. Consider having supervision and share cooking responsibilities until no longer able to participate in those. Accidents with firearms and other hazards in the house should be identified and addressed as well. ? ? ?ABILITY TO BE LEFT ALONE: If patient is unable to contact 911 operator, consider using [employer], or when the need is there, arrange for someone to stay with patients. Smoking is a fire hazard, consider supervision or cessation. Risk of wandering should be assessed by caregiver and if detected at any point, supervision and safe proof recommendations should be instituted. ? ?MEDICATION SUPERVISION: Inability to self-administer medication needs to be constantly addressed. Implement a mechanism to ensure safe administration of the medications. ? ? ?DRIVING: Regarding driving, in patients with progressive memory problems, driving will be impaired. We advise to have someone else do the driving if trouble finding directions or if minor accidents are reported. Independent driving assessment is available to determine safety of driving. ? ? ?If you are interested in the driving assessment, you can contact the following: ? ?[employer] in [place] ? ?[place] [phone] ? ?[facility] [phone] ? ?[facility] [phone] or [phone] ?  ?
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
## 2021-12-25 ENCOUNTER — Telehealth (INDEPENDENT_AMBULATORY_CARE_PROVIDER_SITE_OTHER): Payer: Medicare Other | Admitting: Registered Nurse

## 2021-12-25 DIAGNOSIS — J0110 Acute frontal sinusitis, unspecified: Secondary | ICD-10-CM | POA: Diagnosis not present

## 2021-12-25 DIAGNOSIS — Z1231 Encounter for screening mammogram for malignant neoplasm of breast: Secondary | ICD-10-CM

## 2021-12-25 DIAGNOSIS — E2839 Other primary ovarian failure: Secondary | ICD-10-CM | POA: Diagnosis not present

## 2021-12-25 DIAGNOSIS — Z1211 Encounter for screening for malignant neoplasm of colon: Secondary | ICD-10-CM | POA: Diagnosis not present

## 2021-12-25 MED ORDER — PREDNISONE 10 MG PO TABS: 10.0000 mg | ORAL_TABLET | Freq: Every day | ORAL | 0 refills | Status: DC

## 2021-12-25 MED ORDER — AMOXICILLIN 875 MG PO TABS: 875.0000 mg | ORAL_TABLET | Freq: Two times a day (BID) | ORAL | 0 refills | Status: AC

## 2021-12-25 MED ORDER — AMOXICILLIN 875 MG PO TABS
875.0000 mg | ORAL_TABLET | Freq: Two times a day (BID) | ORAL | 0 refills | Status: DC
Start: 2021-12-25 — End: 2021-12-25

## 2021-12-25 MED ORDER — PREDNISONE 20 MG PO TABS: 20.0000 mg | ORAL_TABLET | Freq: Every day | ORAL | 0 refills | Status: DC

## 2021-12-25 NOTE — Progress Notes (Signed)
? ? ?Telemedicine Encounter- SOAP NOTE Established Patient ? ?This telephone encounter was conducted with the patient's (or proxy's) verbal consent via audio telecommunications: yes/no: Yes ?Patient was instructed to have this encounter in a suitably private space; and to only have persons present to whom they give permission to participate. In addition, patient identity was confirmed by use of name plus two identifiers (DOB and address).  I discussed the limitations, risks, security and privacy concerns of performing an evaluation and management service by telephone and the availability of in person appointments. I also discussed with the patient that there may be a patient responsible charge related to this service. The patient expressed understanding and agreed to proceed. ? ?I spent a total of 14 minutes talking with the patient or their proxy. ? ?Patient at home ?Provider in office ? ?Participants: Kathrin Ruddy, NP and Baron Sane ? ?Chief Complaint  ?Patient presents with  ? Nasal Congestion  ?  Has been going on for a couple of days. She states she is having a lot of sinus pressure and pain in her face, teeth and neck, headache. No fever  ? ? ?Subjective  ? ?Sheri Martinez is a 66 y.o. established patient. Telephone visit today for nasal congestion ? ?HPI ?Onset around 1 week ago ?Sinus pressure, pain, radiates to jaw. Frontal headache. ?No shob, doe, chest pain, headache, nvd, fevers, chills, sweats. ?Has not taken any medications ?Has not taken home COVID test - has had 3 vaccines  ? ?HM ?Due for colonoscy, dexa, mammo ?Wants orders placed today.  ? ?Patient Active Problem List  ? Diagnosis Date Noted  ? 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 Generalized anxiety disorder 11/08/2021  ? Chronic gastritis 11/08/2021  ? Dysrhythmia 11/08/2021  ? GERD (gastroesophageal reflux disease) 11/08/2021  ? Concussion 11/2018  ? S/P left knee arthroscopy 05/04/2013  ? Myalgia  04/04/2010  ? Chest pain, unspecified 12/07/2009  ? Hemorrhoids 06/16/2009  ? Palpitations 02/02/2009  ? Essential hypertension 03/03/2008  ? Allergic rhinitis 03/03/2008  ? ? ?Past Medical History:  ?Diagnosis Date  ? Abdominal pain, right upper quadrant 06/16/2009  ? Allergic rhinitis 03/03/2008  ? Bronchitis 10/12/2011  ? "just got over it'  ? Chest pain, unspecified 12/07/2009  ? Chronic gastritis   ? Complication of anesthesia   ? Concussion 11/2018  ? Dehydration, mild 05/09/2012  ? Diarrhea, infectious, adult 05/09/2012  ? Dysrhythmia   ? palpitation  ? Essential hypertension 03/03/2008  ? Generalized anxiety disorder   ? GERD (gastroesophageal reflux disease)   ? Hemorrhoids 06/16/2009  ? Hiatal hernia   ? 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 Myalgia 04/04/2010  ? Nausea 06/16/2009  ? Palpitations 02/02/2009  ? Pneumonia 10/12/2011  ? "just got over it"  ? PONV (postoperative nausea and vomiting)   ? S/P left knee arthroscopy 05/04/2013  ? Shortness of breath 10/12/2011  ? "at any time; can't relate it to anything; here w/chest tightness; just got over pneumonia/bronchitis"  ? ? ?Current Outpatient Medications  ?Medication Sig Dispense Refill  ? amLODipine (NORVASC) 5 MG tablet TAKE 1 TABLET BY MOUTH DAILY 90 tablet 4  ? amoxicillin (AMOXIL) 875 MG tablet Take 1 tablet (875 mg total) by mouth 2 (two) times daily for 10 days. 20 tablet 0  ? aspirin EC 81 MG tablet Take 81 mg by mouth daily.    ? fluticasone (FLONASE) 50 MCG/ACT nasal spray Place 1 spray into both  nostrils as needed.    ? labetalol (NORMODYNE) 200 MG tablet TAKE 1 TABLET BY MOUTH TWICE DAILY 180 tablet 4  ? losartan (COZAAR) 50 MG tablet TAKE 1 TABLET BY MOUTH DAILY 90 tablet 4  ? predniSONE (DELTASONE) 10 MG tablet Take 1 tablet (10 mg total) by mouth daily with breakfast. 5 tablet 0  ? donepezil (ARICEPT) 10 MG tablet Take one tablet daily (Patient not taking: Reported on 12/25/2021) 30 tablet 11   ? ?No current facility-administered medications for this visit.  ? ? ?Allergies  ?Allergen Reactions  ? Penicillins   ?  Dizzy. Can tolerate Amoxicillin.  ? Sulfamethoxazole   ?  REACTION: rash  ? ? ?Social History  ? ?Socioeconomic History  ? Marital status: Married  ?  Spouse name: Not on file  ? Number of children: 2  ? Years of education: 72  ? Highest education level: High school graduate  ?Occupational History  ? Occupation: Retired  ?  Employer: [employer]  ?  Comment: HR support  ?Tobacco Use  ? Smoking status: Former  ?  Packs/day: 0.50  ?  Years: 3.00  ?  Pack years: 1.50  ?  Types: Cigarettes  ?  Quit date: 12/22/1978  ?  Years since quitting: 43.0  ? Smokeless tobacco: Never  ?Vaping Use  ? Vaping Use: Never used  ?Substance and Sexual Activity  ? Alcohol use: No  ? Drug use: No  ? Sexual activity: Yes  ?Other Topics Concern  ? Not on file  ?Social History Narrative  ? Daily caffeine  ? One story home  ? Right handed  ? ?Social Determinants of Health  ? ?Financial Resource Strain: Not on file  ?Food Insecurity: Not on file  ?Transportation Needs: Not on file  ?Physical Activity: Not on file  ?Stress: Not on file  ?Social Connections: Not on file  ?Intimate Partner Violence: Not on file  ? ? ?Review of Systems  ?Constitutional: Negative.  Negative for chills, diaphoresis, fever, malaise/fatigue and weight loss.  ?HENT:  Positive for congestion and sinus pain.   ?Eyes: Negative.   ?Respiratory: Negative.    ?Cardiovascular: Negative.   ?Gastrointestinal: Negative.   ?Genitourinary: Negative.   ?Musculoskeletal: Negative.   ?Skin: Negative.   ?Neurological: Negative.   ?Endo/Heme/Allergies: Negative.   ?Psychiatric/Behavioral: Negative.    ?All other systems reviewed and are negative. ? ?Objective  ? ?Vitals as reported by the patient: ?There were no vitals filed for this visit. ? ?[name] was seen today for nasal congestion. ? ?Diagnoses and all orders for this visit: ? ?Acute non-recurrent frontal sinusitis ?-      amoxicillin (AMOXIL) 875 MG tablet; Take 1 tablet (875 mg total) by mouth 2 (two) times daily for 10 days. ?-     predniSONE (DELTASONE) 10 MG tablet; Take 1 tablet (10 mg total) by mouth daily with breakfast. ? ?Screen for colon cancer ?-     Ambulatory referral to Gastroenterology ? ?Estrogen deficiency ?-     DG BONE DENSITY (DXA); Future ? ?Screening mammogram for breast cancer ?-     MM Digital Screening; Future ? ? ? ?PLAN ?Screenings ordered as above ?Amoxicillin and prednisone for sinusitis ?She has been encouraged strongly to present for  AWV and labs at her soonest convenience ?Patient encouraged to call clinic with any questions, comments, or concerns. ? ?I discussed the assessment and treatment plan with the patient. The patient was provided an opportunity to ask questions and all were answered. The  patient agreed with the plan and demonstrated an understanding of the instructions. ?  ?The patient was advised to call back or seek an in-person evaluation if the symptoms worsen or if the condition fails to improve as anticipated. ? ?I provided 14 minutes of non-face-to-face time during this encounter. ? ?Maximiano Coss, NP ? ? ?

## 2021-12-25 NOTE — Addendum Note (Signed)
Addended by: Maximiano Coss on: 12/25/2021 02:42 PM ? ? Modules accepted: Orders ? ?

## 2022-03-03 ENCOUNTER — Other Ambulatory Visit: Payer: Self-pay | Admitting: Physician Assistant

## 2022-03-05 ENCOUNTER — Other Ambulatory Visit: Payer: Self-pay | Admitting: Physician Assistant

## 2022-04-16 ENCOUNTER — Other Ambulatory Visit: Payer: Self-pay | Admitting: Physician Assistant

## 2022-06-05 ENCOUNTER — Other Ambulatory Visit: Payer: Self-pay | Admitting: Registered Nurse

## 2022-06-05 ENCOUNTER — Other Ambulatory Visit: Payer: Self-pay

## 2022-06-05 ENCOUNTER — Ambulatory Visit
Admission: RE | Admit: 2022-06-05 | Discharge: 2022-06-05 | Disposition: A | Discharge status: Home / Self Care | Payer: Medicare Other | Source: Ambulatory Visit | Attending: Family Medicine | Admitting: Family Medicine

## 2022-06-05 ENCOUNTER — Other Ambulatory Visit: Payer: Self-pay | Admitting: Family Medicine

## 2022-06-05 ENCOUNTER — Ambulatory Visit: Payer: BLUE CROSS/BLUE SHIELD

## 2022-06-05 ENCOUNTER — Other Ambulatory Visit: Payer: BLUE CROSS/BLUE SHIELD

## 2022-06-05 DIAGNOSIS — E2839 Other primary ovarian failure: Secondary | ICD-10-CM

## 2022-06-07 ENCOUNTER — Ambulatory Visit (INDEPENDENT_AMBULATORY_CARE_PROVIDER_SITE_OTHER): Payer: Medicare Other | Admitting: Family Medicine

## 2022-06-07 VITALS — BP 148/78 | HR 67 | Temp 98.2°F | Resp 16 | Ht 64.0 in | Wt 146.2 lb

## 2022-06-07 DIAGNOSIS — M542 Cervicalgia: Secondary | ICD-10-CM | POA: Diagnosis not present

## 2022-06-07 DIAGNOSIS — Z1211 Encounter for screening for malignant neoplasm of colon: Secondary | ICD-10-CM | POA: Diagnosis not present

## 2022-06-07 DIAGNOSIS — R0981 Nasal congestion: Secondary | ICD-10-CM | POA: Diagnosis not present

## 2022-06-07 DIAGNOSIS — J309 Allergic rhinitis, unspecified: Secondary | ICD-10-CM | POA: Diagnosis not present

## 2022-06-07 MED ORDER — AZELASTINE HCL 0.1 % NA SOLN: 1.0000 | Freq: Two times a day (BID) | NASAL | 6 refills | Status: DC

## 2022-06-07 MED ORDER — CYCLOBENZAPRINE HCL 5 MG PO TABS: 2.5000 mg | ORAL_TABLET | Freq: Three times a day (TID) | ORAL | 1 refills | Status: DC | PRN

## 2022-06-07 NOTE — Progress Notes (Signed)
Subjective:  Patient ID: Sheri Martinez, female    DOB: 1956/04/04  Age: 66 y.o. MRN: [id number]  CC:  Chief Complaint  Patient presents with   Neck Pain    Pt notes neck pain and stiffness for last 2 weeks full range of motion but sore with movements,    Nasal Congestion    Pt reports some seasonal congestion in her sinuses and wondered what she can take to help with this notes fall and spring non serious issue    Referral    Due for colonoscopy would like that ordered last done 2012     HPI Sheri Martinez presents for   Neck pain  - started few weeks ago, sore in the bones. No injury, or change in activity. Stiff/sore feeling. No prior surgery/injury.  No shoulder/arm radiation. No HA, fever, photo/phonophobia.  Tx: heat - some relief.  Hx of gastritis, ulcer. No renal disease.   Nasal congestion Seasonal allergies - springtime or mold/dust. Some congestion now.  Nasacort only when needed. No daily meds. Moderate relief. No antihistamines. Prior sinusitis in March.  No pain/fever.   Health maintenance Due for colonoscopy.  Agrees to referral.     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 ([place]) 11/09/2021   Generalized anxiety disorder 11/08/2021   Chronic gastritis 11/08/2021   Dysrhythmia 11/08/2021   GERD (gastroesophageal reflux disease) 11/08/2021   Concussion 11/2018   S/P left knee arthroscopy 05/04/2013   Myalgia 04/04/2010   Chest pain, unspecified 12/07/2009   Hemorrhoids 06/16/2009   Palpitations 02/02/2009   Essential hypertension 03/03/2008   Allergic rhinitis 03/03/2008   Past Medical History:  Diagnosis Date   Abdominal pain, right upper quadrant 06/16/2009   Allergic rhinitis 03/03/2008   Bronchitis 10/12/2011   "just got over it'   Chest pain, unspecified 12/07/2009   Chronic gastritis    Complication of anesthesia    Concussion 11/2018   Dehydration, mild  05/09/2012   Diarrhea, infectious, adult 05/09/2012   Dysrhythmia    palpitation   Essential hypertension 03/03/2008   Generalized anxiety disorder    GERD (gastroesophageal reflux disease)    Hemorrhoids 06/16/2009   Hiatal hernia    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 11/09/2021   Myalgia 04/04/2010   Nausea 06/16/2009   Palpitations 02/02/2009   Pneumonia 10/12/2011   "just got over it"   PONV (postoperative nausea and vomiting)    S/P left knee arthroscopy 05/04/2013   Shortness of breath 10/12/2011   "at any time; can't relate it to anything; here w/chest tightness; just got over pneumonia/bronchitis"   Past Surgical History:  Procedure Laterality Date   ABDOMINAL HYSTERECTOMY  ~ 1989   TAH; BSO; due to endometriosis   CHOLECYSTECTOMY  02/2011   DILATION AND CURETTAGE OF UTERUS  1980's   NASAL SEPTUM SURGERY  1990's   OOPHORECTOMY     (tah/bso) (off estrogen 2005)   Allergies  Allergen Reactions   Penicillins     Dizzy. Can tolerate Amoxicillin.   Sulfamethoxazole     REACTION: rash   Prior to Admission medications   Medication Sig Start Date End Date Taking? Authorizing Provider  amLODipine (NORVASC) 5 MG tablet TAKE 1 TABLET BY MOUTH DAILY 02/06/21  Yes Baldwin Jamaica, PA-C  aspirin EC 81 MG tablet Take 81 mg by mouth daily.   Yes [provider]  fluticasone (FLONASE) 50 MCG/ACT nasal spray  Place 1 spray into both nostrils as needed. 07/05/18  Yes [provider]  labetalol (NORMODYNE) 200 MG tablet TAKE 1 TABLET BY MOUTH TWICE DAILY 03/05/22  Yes Baldwin Jamaica, PA-C  losartan (COZAAR) 50 MG tablet TAKE 1 TABLET BY MOUTH DAILY. Please call [phone] to schedule an appointment for future refills. Thank you. 04/16/22  Yes Baldwin Jamaica, PA-C  predniSONE (DELTASONE) 20 MG tablet Take 1 tablet (20 mg total) by mouth daily with breakfast. 12/25/21  Yes Maximiano Coss, NP   Social History   Socioeconomic History    Marital status: Married    Spouse name: Not on file   Number of children: 2   Years of education: 12   Highest education level: High school graduate  Occupational History   Occupation: Retired    Fish farm manager: [employer]    Comment: HR support  Tobacco Use   Smoking status: Former    Packs/day: 0.50    Years: 3.00    Total pack years: 1.50    Types: Cigarettes    Quit date: 12/22/1978    Years since quitting: 43.4   Smokeless tobacco: Never  Vaping Use   Vaping Use: Never used  Substance and Sexual Activity   Alcohol use: No   Drug use: No   Sexual activity: Yes  Other Topics Concern   Not on file  Social History Narrative   Daily caffeine   One story home   Right handed   Social Determinants of Health   Financial Resource Strain: Not on file  Food Insecurity: Not on file  Transportation Needs: Not on file  Physical Activity: Not on file  Stress: Not on file  Social Connections: Not on file  Intimate Partner Violence: Not on file    Review of Systems Per HPI.   Objective:   Vitals:   06/07/22 1535 06/07/22 1540  BP: (!) 160/82 (!) 148/78  Pulse: 67   Resp: 16   Temp: 98.2 F (36.8 C)   TempSrc: Oral   SpO2: 98%   Weight: 146 lb 3.2 oz (66.3 kg)   Height: '5\' 4"'$  (1.626 m)   Currently with neck pain as above.    Physical Exam Vitals reviewed.  Constitutional:      Appearance: Normal appearance. She is well-developed.  HENT:     Head: Normocephalic and atraumatic.     Nose: Nose normal. No congestion.     Comments: Sinuses nontender.     Mouth/Throat:     Mouth: Mucous membranes are moist.  Eyes:     Conjunctiva/sclera: Conjunctivae normal.     Pupils: Pupils are equal, round, and reactive to light.  Neck:     Vascular: No carotid bruit.  Cardiovascular:     Rate and Rhythm: Normal rate and regular rhythm.     Heart sounds: Normal heart sounds.  Pulmonary:     Effort: Pulmonary effort is normal.     Breath sounds: Normal breath sounds.  Abdominal:      Palpations: Abdomen is soft. There is no pulsatile mass.     Tenderness: There is no abdominal tenderness.  Musculoskeletal:     Right lower leg: No edema.     Left lower leg: No edema.     Comments: C spine:  Diffuse mild discomfort over lower mid C-spine without focal swelling, or skin changes.  No crepitus.  Skin intact.  Minimal discomfort of paraspinal musculature.  Minimal decreased extension, primarily decreased left rotation versus right rotation and right  lateral flexion versus left lateral flexion.  No radiation of pain to shoulders or arms.   Skin:    General: Skin is warm and dry.  Neurological:     Mental Status: She is alert and oriented to person, place, and time.  Psychiatric:        Mood and Affect: Mood normal.        Behavior: Behavior normal.     Assessment & Plan:  Shania Bjelland is a 66 y.o. female . Neck pain - Plan: cyclobenzaprine (FLEXERIL) 5 MG tablet, DG Cervical Spine Complete  -No known trauma, possible underlying degenerative disc disease.  Trial of low-dose Flexeril, half dosing due to potential side effects and risks which were discussed.  Avoiding NSAIDs due to previous gastric issues and ulcer. check imaging, RTC precautions given.  Screening for colon cancer - Plan: Ambulatory referral to Gastroenterology  Allergic rhinitis, unspecified seasonality, unspecified trigger Nasal congestion - Plan: azelastine (ASTELIN) 0.1 % nasal spray   -Persistent symptoms with meds as above, add Astelin nasal spray with potential side effects discussed, RTC precautions if not improving.  Meds ordered this encounter  Medications   azelastine (ASTELIN) 0.1 % nasal spray    Sig: Place 1 spray into both nostrils 2 (two) times daily. Use in each nostril as directed    Dispense:  30 mL    Refill:  6   cyclobenzaprine (FLEXERIL) 5 MG tablet    Sig: Take 0.5-1 tablets (2.5-5 mg total) by mouth 3 (three) times daily as needed for muscle spasms (start qhs prn due to  sedation).    Dispense:  15 tablet    Refill:  1   Patient Instructions  For allergies - start steroid nasal spray daily. Allegra, claritin or zyrtec once per day.  Add astelin nasal spray if needed.    X-ray for neck at [facility] location below.  Heat or ice is fine for now, see other information on neck pain below.  I would avoid Advil, Aleve or other NSAIDs for now given your stomach issues previously.  Low-dose muscle relaxant if needed but be careful of fall risk and sedation with that medication. Follow up if not improving in next 10 days.  Return to the clinic or go to the nearest emergency room if any of your symptoms worsen or new symptoms occur.    [facility] in 8:30-4:30 during weekdays, no appointment needed [address]  [place], [place] [id number]  Allergic Rhinitis, Adult  Allergic rhinitis is an allergic reaction that affects the mucous membrane inside the nose. The mucous membrane is the tissue that produces mucus. There are two types of allergic rhinitis: Seasonal. This type is also called hay fever and happens only during certain seasons. Perennial. This type can happen at any time of the year. Allergic rhinitis cannot be spread from person to person. This condition can be mild, moderate, or severe. It can develop at any age and may be outgrown. What are the causes? This condition is caused by allergens. These are things that can cause an allergic reaction. Allergens may differ for seasonal allergic rhinitis and perennial allergic rhinitis. Seasonal allergic rhinitis is triggered by pollen. Pollen can come from grasses, trees, and weeds. Perennial allergic rhinitis may be triggered by: Dust mites. Proteins in a pet's urine, saliva, or dander. Dander is dead skin cells from a pet. Smoke, mold, or car fumes. What increases the risk? You are more likely to develop this condition if you have  a family history of allergies or other conditions related to  allergies, including: Allergic conjunctivitis. This is inflammation of parts of the eyes and eyelids. Asthma. This condition affects the lungs and makes it hard to breathe. Atopic dermatitis or eczema. This is long term (chronic) inflammation of the skin. Food allergies. What are the signs or symptoms? Symptoms of this condition include: Sneezing or coughing. A stuffy nose (nasal congestion), itchy nose, or nasal discharge. Itchy eyes and tearing of the eyes. A feeling of mucus dripping down the back of your throat (postnasal drip). Trouble sleeping. Tiredness or fatigue. Headache. Sore throat. How is this diagnosed? This condition may be diagnosed with your symptoms, medical history, and physical exam. Your health care provider may check for related conditions, such as: Asthma. Pink eye. This is eye inflammation caused by infection (conjunctivitis). Ear infection. Upper respiratory infection. This is an infection in the nose, throat, or upper airways. You may also have tests to find out which allergens trigger your symptoms. These may include skin tests or blood tests. How is this treated? There is no cure for this condition, but treatment can help control symptoms. Treatment may include: Taking medicines that block allergy symptoms, such as corticosteroids and antihistamines. Medicine may be given as a shot, nasal spray, or pill. Avoiding any allergens. Being exposed again and again to tiny amounts of allergens to help you build a defense against allergens (immunotherapy). This is done if other treatments have not helped. It may include: Allergy shots. These are injected medicines that have small amounts of allergen in them. Sublingual immunotherapy. This involves taking small doses of a medicine with allergen in it under your tongue. If these treatments do not work, your health care provider may prescribe newer, stronger medicines. Follow these instructions at home: Avoiding  allergens Find out what you are allergic to and avoid those allergens. These are some things you can do to help avoid allergens: If you have perennial allergies: Replace carpet with wood, tile, or vinyl flooring. Carpet can trap dander and dust. Do not smoke. Do not allow smoking in your home. Change your heating and air conditioning filters at least once a month. If you have seasonal allergies, take these steps during allergy season: Keep windows closed as much as possible. Plan outdoor activities when pollen counts are lowest. Check pollen counts before you plan outdoor activities. When coming indoors, change clothing and shower before sitting on furniture or bedding. If you have a pet in the house that produces allergens: Keep the pet out of the bedroom. Vacuum, sweep, and dust regularly. General instructions Take over-the-counter and prescription medicines only as told by your health care provider. Drink enough fluid to keep your urine pale yellow. Keep all follow-up visits as told by your health care provider. This is important. Where to find more information American Academy of Allergy, Asthma & Immunology: www.aaaai.org Contact a health care provider if: You have a fever. You develop a cough that does not go away. You make whistling sounds when you breathe (wheeze). Your symptoms slow you down or stop you from doing your normal activities each day. Get help right away if: You have shortness of breath. This symptom may represent a serious problem that is an emergency. Do not wait to see if the symptom will go away. Get medical help right away. Call your local emergency services (911 in the [place] Do not drive yourself to the hospital. Summary Allergic rhinitis may be managed by taking medicines as directed  and avoiding allergens. If you have seasonal allergies, keep windows closed as much as possible during allergy season. Contact your health care provider if you develop a fever  or a cough that does not go away. This information is not intended to replace advice given to you by your health care provider. Make sure you discuss any questions you have with your health care provider. Document Revised: 11/20/2019 Document Reviewed: 09/29/2019 Elsevier Patient Education  2023 [place],   Merri Ray, MD [place] 06/07/22 4:33 PM

## 2022-06-07 NOTE — Patient Instructions (Addendum)
For allergies - start steroid nasal spray daily. Allegra, claritin or zyrtec once per day.  Add astelin nasal spray if needed.    X-ray for neck at [facility] location below.  Heat or ice is fine for now, see other information on neck pain below.  I would avoid Advil, Aleve or other NSAIDs for now given your stomach issues previously.  Low-dose muscle relaxant if needed but be careful of fall risk and sedation with that medication. Follow up if not improving in next 10 days.  Return to the clinic or go to the nearest emergency room if any of your symptoms worsen or new symptoms occur.    [facility] in 8:30-4:30 during weekdays, no appointment needed [place].  [place], [place] [id number]  Allergic Rhinitis, Adult  Allergic rhinitis is an allergic reaction that affects the mucous membrane inside the nose. The mucous membrane is the tissue that produces mucus. There are two types of allergic rhinitis: Seasonal. This type is also called hay fever and happens only during certain seasons. Perennial. This type can happen at any time of the year. Allergic rhinitis cannot be spread from person to person. This condition can be mild, moderate, or severe. It can develop at any age and may be outgrown. What are the causes? This condition is caused by allergens. These are things that can cause an allergic reaction. Allergens may differ for seasonal allergic rhinitis and perennial allergic rhinitis. Seasonal allergic rhinitis is triggered by pollen. Pollen can come from grasses, trees, and weeds. Perennial allergic rhinitis may be triggered by: Dust mites. Proteins in a pet's urine, saliva, or dander. Dander is dead skin cells from a pet. Smoke, mold, or car fumes. What increases the risk? You are more likely to develop this condition if you have a family history of allergies or other conditions related to allergies, including: Allergic conjunctivitis. This is inflammation of parts of the  eyes and eyelids. Asthma. This condition affects the lungs and makes it hard to breathe. Atopic dermatitis or eczema. This is long term (chronic) inflammation of the skin. Food allergies. What are the signs or symptoms? Symptoms of this condition include: Sneezing or coughing. A stuffy nose (nasal congestion), itchy nose, or nasal discharge. Itchy eyes and tearing of the eyes. A feeling of mucus dripping down the back of your throat (postnasal drip). Trouble sleeping. Tiredness or fatigue. Headache. Sore throat. How is this diagnosed? This condition may be diagnosed with your symptoms, medical history, and physical exam. Your health care provider may check for related conditions, such as: Asthma. Pink eye. This is eye inflammation caused by infection (conjunctivitis). Ear infection. Upper respiratory infection. This is an infection in the nose, throat, or upper airways. You may also have tests to find out which allergens trigger your symptoms. These may include skin tests or blood tests. How is this treated? There is no cure for this condition, but treatment can help control symptoms. Treatment may include: Taking medicines that block allergy symptoms, such as corticosteroids and antihistamines. Medicine may be given as a shot, nasal spray, or pill. Avoiding any allergens. Being exposed again and again to tiny amounts of allergens to help you build a defense against allergens (immunotherapy). This is done if other treatments have not helped. It may include: Allergy shots. These are injected medicines that have small amounts of allergen in them. Sublingual immunotherapy. This involves taking small doses of a medicine with allergen in it under your tongue. If these treatments  do not work, your health care provider may prescribe newer, stronger medicines. Follow these instructions at home: Avoiding allergens Find out what you are allergic to and avoid those allergens. These are some  things you can do to help avoid allergens: If you have perennial allergies: Replace carpet with wood, tile, or vinyl flooring. Carpet can trap dander and dust. Do not smoke. Do not allow smoking in your home. Change your heating and air conditioning filters at least once a month. If you have seasonal allergies, take these steps during allergy season: Keep windows closed as much as possible. Plan outdoor activities when pollen counts are lowest. Check pollen counts before you plan outdoor activities. When coming indoors, change clothing and shower before sitting on furniture or bedding. If you have a pet in the house that produces allergens: Keep the pet out of the bedroom. Vacuum, sweep, and dust regularly. General instructions Take over-the-counter and prescription medicines only as told by your health care provider. Drink enough fluid to keep your urine pale yellow. Keep all follow-up visits as told by your health care provider. This is important. Where to find more information American Academy of Allergy, Asthma & Immunology: www.aaaai.org Contact a health care provider if: You have a fever. You develop a cough that does not go away. You make whistling sounds when you breathe (wheeze). Your symptoms slow you down or stop you from doing your normal activities each day. Get help right away if: You have shortness of breath. This symptom may represent a serious problem that is an emergency. Do not wait to see if the symptom will go away. Get medical help right away. Call your local emergency services (911 in the [place] Do not drive yourself to the hospital. Summary Allergic rhinitis may be managed by taking medicines as directed and avoiding allergens. If you have seasonal allergies, keep windows closed as much as possible during allergy season. Contact your health care provider if you develop a fever or a cough that does not go away. This information is not intended to replace advice  given to you by your health care provider. Make sure you discuss any questions you have with your health care provider. Document Revised: 11/20/2019 Document Reviewed: 09/29/2019 Elsevier Patient Education  Sheri Martinez.

## 2022-06-08 ENCOUNTER — Ambulatory Visit (INDEPENDENT_AMBULATORY_CARE_PROVIDER_SITE_OTHER)
Admission: RE | Admit: 2022-06-08 | Discharge: 2022-06-08 | Disposition: A | Discharge status: Home / Self Care | Payer: Medicare Other | Source: Ambulatory Visit | Attending: Family Medicine | Admitting: Family Medicine

## 2022-06-08 DIAGNOSIS — M542 Cervicalgia: Secondary | ICD-10-CM | POA: Diagnosis not present

## 2022-06-09 ENCOUNTER — Encounter: Payer: Self-pay | Admitting: Family Medicine

## 2022-06-19 ENCOUNTER — Encounter: Payer: Self-pay | Admitting: Gastroenterology

## 2022-06-21 ENCOUNTER — Telehealth: Payer: Self-pay

## 2022-06-21 ENCOUNTER — Ambulatory Visit (INDEPENDENT_AMBULATORY_CARE_PROVIDER_SITE_OTHER): Payer: Medicare Other | Admitting: Physician Assistant

## 2022-06-21 ENCOUNTER — Telehealth: Payer: Self-pay | Admitting: Physician Assistant

## 2022-06-21 ENCOUNTER — Encounter: Payer: Self-pay | Admitting: Physician Assistant

## 2022-06-21 VITALS — BP 150/80 | HR 83 | Ht 64.0 in | Wt 143.0 lb

## 2022-06-21 DIAGNOSIS — F0280 Dementia in other diseases classified elsewhere without behavioral disturbance: Secondary | ICD-10-CM

## 2022-06-21 DIAGNOSIS — G309 Alzheimer's disease, unspecified: Secondary | ICD-10-CM | POA: Diagnosis not present

## 2022-06-21 MED ORDER — MEMANTINE HCL 5 MG PO TABS: ORAL_TABLET | ORAL | 11 refills | Status: DC

## 2022-06-21 NOTE — Patient Instructions (Signed)
It was a pleasure to see you today at our office.   Recommendations:  Follow up in 6  months Continue donepezil 10 mg daily Start memantine 5 mg , take one at night for 2 weeks then increase to 2 times a day if tolerated Recommend Silver Sneakers exercise program  Start B12 supplements   Whom to call:  Memory  decline, memory medications: Call our office [phone]   For psychiatric meds, mood meds: Please have your primary care physician manage these medications.   Counseling regarding caregiver distress, including caregiver depression, anxiety and issues regarding community resources, adult day care programs, adult living facilities, or memory care questions:   Feel free to contact Magna, Social Worker at [phone]   For assessment of decision of mental capacity and competency:  Call Dr. Anthoney Harada, geriatric psychiatrist at [phone]  For guidance in geriatric dementia issues please call [facility] [phone]  For guidance regarding [facility] and if placement were needed at the facility, contact Arnell Asal, Social Worker tel: [phone]  If you have any severe symptoms of a stroke, or other severe issues such as confusion,severe chills or fever, etc call 911 or go to the ER as you may need to be evaluated further   Feel free to visit Facebook page " [email or web address]" for tips of how to care for people with memory problems.         RECOMMENDATIONS FOR ALL PATIENTS WITH MEMORY PROBLEMS: 1. Continue to exercise (Recommend 30 minutes of walking everyday, or 3 hours every week) 2. Increase social interactions - continue going to [place] and enjoy social gatherings with friends and family 3. Eat healthy, avoid fried foods and eat more fruits and vegetables 4. Maintain adequate blood pressure, blood sugar, and blood cholesterol level. Reducing the risk of stroke and cardiovascular disease also helps promoting better  memory. 5. Avoid stressful situations. Live a simple life and avoid aggravations. Organize your time and prepare for the next day in anticipation. 6. Sleep well, avoid any interruptions of sleep and avoid any distractions in the bedroom that may interfere with adequate sleep quality 7. Avoid sugar, avoid sweets as there is a strong link between excessive sugar intake, diabetes, and cognitive impairment We discussed the Mediterranean diet, which has been shown to help patients reduce the risk of progressive memory disorders and reduces cardiovascular risk. This includes eating fish, eat fruits and green leafy vegetables, nuts like almonds and hazelnuts, walnuts, and also use olive oil. Avoid fast foods and fried foods as much as possible. Avoid sweets and sugar as sugar use has been linked to worsening of memory function.  There is always a concern of gradual progression of memory problems. If this is the case, then we may need to adjust level of care according to patient needs. Support, both to the patient and caregiver, should then be put into place.    FALL PRECAUTIONS: Be cautious when walking. Scan the area for obstacles that may increase the risk of trips and falls. When getting up in the mornings, sit up at the edge of the bed for a few minutes before getting out of bed. Consider elevating the bed at the head end to avoid drop of blood pressure when getting up. Walk always in a well-lit room (use night lights in the walls). Avoid area rugs or power cords from appliances in the middle of the walkways. Use a walker or a cane if necessary and consider physical therapy  for balance exercise. Get your eyesight checked regularly.  FINANCIAL OVERSIGHT: Supervision, especially oversight when making financial decisions or transactions is also recommended.  HOME SAFETY: Consider the safety of the kitchen when operating appliances like stoves, microwave oven, and blender. Consider having supervision and share  cooking responsibilities until no longer able to participate in those. Accidents with firearms and other hazards in the house should be identified and addressed as well.   ABILITY TO BE LEFT ALONE: If patient is unable to contact 911 operator, consider using LifeLine, or when the need is there, arrange for someone to stay with patients. Smoking is a fire hazard, consider supervision or cessation. Risk of wandering should be assessed by caregiver and if detected at any point, supervision and safe proof recommendations should be instituted.  MEDICATION SUPERVISION: Inability to self-administer medication needs to be constantly addressed. Implement a mechanism to ensure safe administration of the medications.   DRIVING: Regarding driving, in patients with progressive memory problems, driving will be impaired. We advise to have someone else do the driving if trouble finding directions or if minor accidents are reported. Independent driving assessment is available to determine safety of driving.   If you are interested in the driving assessment, you can contact the following:  [employer] in [place]  [place] [phone]  [place]  [facility] [phone] or [phone]

## 2022-06-21 NOTE — Telephone Encounter (Signed)
Pt's husband called in and left a message. The pt has an appt today at 2:30 and wants to make sure we are aware the pt has gotten progressively worse. She asked multiple times last night about todays appt. He stated she has called him 8 times today also asking about today's appt. He says it's hard for him to say some of this stuff in front of her so he wants [place] to be aware.

## 2022-06-21 NOTE — Telephone Encounter (Signed)
Called patient to schedule Annual Wellness Visit, requested the patient call back in order to schedule appropriate type.
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
## 2022-07-03 ENCOUNTER — Ambulatory Visit (AMBULATORY_SURGERY_CENTER): Payer: Medicare Other | Admitting: *Deleted

## 2022-07-03 VITALS — Ht 64.0 in | Wt 148.2 lb

## 2022-07-03 DIAGNOSIS — Z1211 Encounter for screening for malignant neoplasm of colon: Secondary | ICD-10-CM

## 2022-07-03 MED ORDER — NA SULFATE-K SULFATE-MG SULF 17.5-3.13-1.6 GM/177ML PO SOLN: 1.0000 | Freq: Once | ORAL | 0 refills | Status: AC

## 2022-07-03 NOTE — Progress Notes (Signed)
No egg or soy allergy known to patient  No issues known to pt with past sedation with any surgeries or procedures Patient denies ever being told they had issues or difficulty with intubation  No FH of Malignant Hyperthermia Pt is not on diet pills Pt is not on  home 02  Pt is not on blood thinners  Pt denies issues with constipation  No A fib or A flutter Have any cardiac testing pending--no Pt instructed to use [email or web address] or GoodRx for a price reduction on prep   

## 2022-07-23 ENCOUNTER — Encounter: Payer: Self-pay | Admitting: Certified Registered Nurse Anesthetist

## 2022-07-31 ENCOUNTER — Ambulatory Visit (AMBULATORY_SURGERY_CENTER): Payer: Medicare Other | Admitting: Gastroenterology

## 2022-07-31 ENCOUNTER — Encounter: Payer: Self-pay | Admitting: Gastroenterology

## 2022-07-31 VITALS — BP 110/59 | HR 60 | Temp 97.7°F | Resp 16 | Ht 64.0 in | Wt 148.2 lb

## 2022-07-31 DIAGNOSIS — Z1211 Encounter for screening for malignant neoplasm of colon: Secondary | ICD-10-CM

## 2022-07-31 DIAGNOSIS — D122 Benign neoplasm of ascending colon: Secondary | ICD-10-CM

## 2022-07-31 MED ORDER — SODIUM CHLORIDE 0.9 % IV SOLN: 500.0000 mL | Freq: Once | INTRAVENOUS | Status: DC

## 2022-07-31 NOTE — Patient Instructions (Signed)
Please read handouts provided. Continue present medications. Await pathology results.   YOU HAD AN ENDOSCOPIC PROCEDURE TODAY AT THE [facility]:   Refer to the procedure report that was given to you for any specific questions about what was found during the examination.  If the procedure report does not answer your questions, please call your gastroenterologist to clarify.  If you requested that your care partner not be given the details of your procedure findings, then the procedure report has been included in a sealed envelope for you to review at your convenience later.  YOU SHOULD EXPECT: Some feelings of bloating in the abdomen. Passage of more gas than usual.  Walking can help get rid of the air that was put into your GI tract during the procedure and reduce the bloating. If you had a lower endoscopy (such as a colonoscopy or flexible sigmoidoscopy) you may notice spotting of blood in your stool or on the toilet paper. If you underwent a bowel prep for your procedure, you may not have a normal bowel movement for a few days.  Please Note:  You might notice some irritation and congestion in your nose or some drainage.  This is from the oxygen used during your procedure.  There is no need for concern and it should clear up in a day or so.  SYMPTOMS TO REPORT IMMEDIATELY:  Following lower endoscopy (colonoscopy or flexible sigmoidoscopy):  Excessive amounts of blood in the stool  Significant tenderness or worsening of abdominal pains  Swelling of the abdomen that is new, acute  Fever of 100F or higher  For urgent or emergent issues, a gastroenterologist can be reached at any hour by calling [phone]. Do not use MyChart messaging for urgent concerns.    DIET:  We do recommend a small meal at first, but then you may proceed to your regular diet.  Drink plenty of fluids but you should avoid alcoholic beverages for 24 hours.  ACTIVITY:  You should plan to take it easy for  the rest of today and you should NOT DRIVE or use heavy machinery until tomorrow (because of the sedation medicines used during the test).    FOLLOW UP: Our staff will call the number listed on your records the next business day following your procedure.  We will call around 7:15- 8:00 am to check on you and address any questions or concerns that you may have regarding the information given to you following your procedure. If we do not reach you, we will leave a message.     If any biopsies were taken you will be contacted by phone or by letter within the next 1-3 weeks.  Please call us at [phone] if you have not heard about the biopsies in 3 weeks.    SIGNATURES/CONFIDENTIALITY: You and/or your care partner have signed paperwork which will be entered into your electronic medical record.  These signatures attest to the fact that that the information above on your After Visit Summary has been reviewed and is understood.  Full responsibility of the confidentiality of this discharge information lies with you and/or your care-partner. 

## 2022-07-31 NOTE — Progress Notes (Unsigned)
Report given to PACU, vss 

## 2022-07-31 NOTE — Op Note (Signed)
[facility] Patient Name: Sheri Martinez Procedure Date: 07/31/2022 1:37 PM MRN: [id number] Endoscopist: Mauri Pole , MD Age: 66 Referring MD:  Date of Birth: [birth year] Gender: Female Account #: [id number] Procedure:                Colonoscopy Indications:              Screening for colorectal malignant neoplasm Medicines:                Monitored Anesthesia Care Procedure:                Pre-Anesthesia Assessment:                           - Prior to the procedure, a History and Physical                            was performed, and patient medications and                            allergies were reviewed. The patient's tolerance of                            previous anesthesia was also reviewed. The risks                            and benefits of the procedure and the sedation                            options and risks were discussed with the patient.                            All questions were answered, and informed consent                            was obtained. Prior Anticoagulants: The patient has                            taken no previous anticoagulant or antiplatelet                            agents. ASA Grade Assessment: II - A patient with                            mild systemic disease. After reviewing the risks                            and benefits, the patient was deemed in                            satisfactory condition to undergo the procedure.                           After obtaining informed consent, the colonoscope  was passed under direct vision. Throughout the                            procedure, the patient's blood pressure, pulse, and                            oxygen saturations were monitored continuously. The                            Olympus PCF-H190DL (#[id number]) Colonoscope was                            introduced through the anus and advanced to the the                            cecum,  identified by appendiceal orifice and                            ileocecal valve. The colonoscopy was performed                            without difficulty. The patient tolerated the                            procedure well. The quality of the bowel                            preparation was good. The ileocecal valve,                            appendiceal orifice, and rectum were photographed. Scope In: 1:43:51 PM Scope Out: 1:56:43 PM Scope Withdrawal Time: 0 hours 9 minutes 27 seconds  Total Procedure Duration: 0 hours 12 minutes 52 seconds  Findings:                 The perianal and digital rectal examinations were                            normal.                           A 5 mm polyp was found in the ascending colon. The                            polyp was sessile. The polyp was removed with a                            cold snare. Resection and retrieval were complete.                           A few small-mouthed diverticula were found in the                            sigmoid colon.  Non-bleeding external and internal hemorrhoids were                            found during retroflexion. The hemorrhoids were                            medium-sized. Complications:            No immediate complications. Estimated Blood Loss:     Estimated blood loss was minimal. Impression:               - One 5 mm polyp in the ascending colon, removed                            with a cold snare. Resected and retrieved.                           - Diverticulosis in the sigmoid colon.                           - Non-bleeding external and internal hemorrhoids. Recommendation:           - Patient has a contact number available for                            emergencies. The signs and symptoms of potential                            delayed complications were discussed with the                            patient. Return to normal activities tomorrow.                             Written discharge instructions were provided to the                            patient.                           - Resume previous diet.                           - Continue present medications.                           - Await pathology results.                           - Repeat colonoscopy in 5-10 years for surveillance                            based on pathology results. Mauri Pole, MD 07/31/2022 2:01:06 PM This report has been signed electronically.

## 2022-07-31 NOTE — Progress Notes (Unsigned)
[facility] History and Physical   Primary Care Physician:  Wendie Agreste, MD   Reason for Procedure:  Colorectal cancer screening  Plan:    Screening colonoscopy with possible interventions as needed     HPI: Sheri Martinez is a very pleasant 66 y.o. female here for screening colonoscopy. Denies any nausea, vomiting, abdominal pain, melena or bright red blood per rectum  The risks and benefits as well as alternatives of endoscopic procedure(s) have been discussed and reviewed. All questions answered. The patient agrees to proceed.    Past Medical History:  Diagnosis Date   Abdominal pain, right upper quadrant 06/16/2009   Allergic rhinitis 03/03/2008   Allergy    Bronchitis 10/12/2011   "just got over it'   Chest pain, unspecified 12/07/2009   Chronic gastritis    Complication of anesthesia    Concussion 11/2018   Dehydration, mild 05/09/2012   Diarrhea, infectious, adult 05/09/2012   Dysrhythmia    palpitation   Essential hypertension 03/03/2008   Generalized anxiety disorder    GERD (gastroesophageal reflux disease)    Hemorrhoids 06/16/2009   Hiatal hernia    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 11/09/2021   Myalgia 04/04/2010   Nausea 06/16/2009   Palpitations 02/02/2009   Pneumonia 10/12/2011   "just got over it"   PONV (postoperative nausea and vomiting)    S/P left knee arthroscopy 05/04/2013   Shortness of breath 10/12/2011   "at any time; can't relate it to anything; here w/chest tightness; just got over pneumonia/bronchitis"    Past Surgical History:  Procedure Laterality Date   ABDOMINAL HYSTERECTOMY  ~ 1989   TAH; BSO; due to endometriosis   CHOLECYSTECTOMY  02/13/2011   COLONOSCOPY     DILATION AND CURETTAGE OF UTERUS  06/16/1979   NASAL SEPTUM SURGERY  06/15/1989   OOPHORECTOMY     (tah/bso) (off estrogen 2005)    Prior to Admission medications   Medication Sig Start Date End Date Taking?  Authorizing Provider  amLODipine (NORVASC) 5 MG tablet TAKE 1 TABLET BY MOUTH DAILY 02/06/21  Yes Baldwin Jamaica, PA-C  aspirin EC 81 MG tablet Take 81 mg by mouth daily.   Yes [provider]  donepezil (ARICEPT) 10 MG tablet Take 10 mg by mouth daily. 06/30/22  Yes [provider]  labetalol (NORMODYNE) 200 MG tablet TAKE 1 TABLET BY MOUTH TWICE DAILY 03/05/22  Yes Baldwin Jamaica, PA-C  losartan (COZAAR) 50 MG tablet TAKE 1 TABLET BY MOUTH DAILY. Please call [phone] to schedule an appointment for future refills. Thank you. 04/16/22  Yes Baldwin Jamaica, PA-C  memantine (NAMENDA) 5 MG tablet Take 1 tablet (5 mg at night) for 2 weeks, then increase to 1 tablet (5 mg) twice a day 06/21/22  Yes Wertman, Coralee Pesa, PA-C  azelastine (ASTELIN) 0.1 % nasal spray Place 1 spray into both nostrils 2 (two) times daily. Use in each nostril as directed 06/07/22   Wendie Agreste, MD  cyclobenzaprine (FLEXERIL) 5 MG tablet Take 0.5-1 tablets (2.5-5 mg total) by mouth 3 (three) times daily as needed for muscle spasms (start qhs prn due to sedation). 06/07/22   Wendie Agreste, MD  fluticasone (FLONASE) 50 MCG/ACT nasal spray Place 1 spray into both nostrils as needed. 07/05/18   [provider]    Current Outpatient Medications  Medication Sig Dispense Refill   amLODipine (NORVASC) 5 MG tablet TAKE 1 TABLET BY MOUTH DAILY 90 tablet 4  aspirin EC 81 MG tablet Take 81 mg by mouth daily.     donepezil (ARICEPT) 10 MG tablet Take 10 mg by mouth daily.     labetalol (NORMODYNE) 200 MG tablet TAKE 1 TABLET BY MOUTH TWICE DAILY 180 tablet 0   losartan (COZAAR) 50 MG tablet TAKE 1 TABLET BY MOUTH DAILY. Please call [phone] to schedule an appointment for future refills. Thank you. 90 tablet 1   memantine (NAMENDA) 5 MG tablet Take 1 tablet (5 mg at night) for 2 weeks, then increase to 1 tablet (5 mg) twice a day 60 tablet 11   azelastine (ASTELIN) 0.1 % nasal spray Place 1 spray  into both nostrils 2 (two) times daily. Use in each nostril as directed 30 mL 6   cyclobenzaprine (FLEXERIL) 5 MG tablet Take 0.5-1 tablets (2.5-5 mg total) by mouth 3 (three) times daily as needed for muscle spasms (start qhs prn due to sedation). 15 tablet 1   fluticasone (FLONASE) 50 MCG/ACT nasal spray Place 1 spray into both nostrils as needed.     Current Facility-Administered Medications  Medication Dose Route Frequency Provider Last Rate Last Admin   0.9 %  sodium chloride infusion  500 mL Intravenous Once Mauri Pole, MD        Allergies as of 07/31/2022 - Review Complete 07/31/2022  Allergen Reaction Noted   Penicillins  12/22/2010   Sulfamethoxazole  03/26/2008    Family History  Problem Relation Age of Onset   Colon cancer Mother    Hypertension Mother    Heart disease Other        grandparent   Ovarian cancer Other        aunt   Crohn's disease Neg Hx    Esophageal cancer Neg Hx    Rectal cancer Neg Hx    Stomach cancer Neg Hx    Ulcerative colitis Neg Hx     Social History   Socioeconomic History   Marital status: Married    Spouse name: Not on file   Number of children: 2   Years of education: 12   Highest education level: High school graduate  Occupational History   Occupation: Retired    Fish farm manager: [employer]    Comment: HR support  Tobacco Use   Smoking status: Former    Packs/day: 0.50    Years: 3.00    Total pack years: 1.50    Types: Cigarettes    Quit date: 12/22/1978    Years since quitting: 43.6    Passive exposure: Never   Smokeless tobacco: Never  Vaping Use   Vaping Use: Never used  Substance and Sexual Activity   Alcohol use: No   Drug use: No   Sexual activity: Yes  Other Topics Concern   Not on file  Social History Narrative   Daily caffeine   One story home   Right handed   Social Determinants of Health   Financial Resource Strain: Not on file  Food Insecurity: Not on file  Transportation Needs: Not on file  Physical  Activity: Not on file  Stress: Not on file  Social Connections: Not on file  Intimate Partner Violence: Not on file    Review of Systems:  All other review of systems negative except as mentioned in the HPI.  Physical Exam: Vital signs in last 24 hours: Blood Pressure (Abnormal) 148/74   Pulse 61   Temperature 97.7 F (36.5 C) (Temporal)   Respiration 11   Height '5\' 4"'$  (1.626  m)   Weight 148 lb 3.2 oz (67.2 kg)   Oxygen Saturation 99%   Body Mass Index 25.44 kg/m  General:   Alert, NAD Lungs:  Clear .   Heart:  Regular rate and rhythm Abdomen:  Soft, nontender and nondistended. Neuro/Psych:  Alert and cooperative. Normal mood and affect. A and O x 3  Reviewed labs, radiology imaging, old records and pertinent past GI work up  Patient is appropriate for planned procedure(s) and anesthesia in an ambulatory setting   K. Denzil Magnuson , MD [phone]

## 2022-07-31 NOTE — Progress Notes (Signed)
Called to room to assist during endoscopic procedure.  Patient ID and intended procedure confirmed with present staff. Received instructions for my participation in the procedure from the performing physician.  

## 2022-08-01 ENCOUNTER — Encounter: Payer: Self-pay | Admitting: Gastroenterology

## 2022-08-01 ENCOUNTER — Telehealth: Payer: Self-pay | Admitting: *Deleted

## 2022-08-01 NOTE — Telephone Encounter (Signed)
  Follow up Call-     07/31/2022   12:33 PM  Call back number  Post procedure Call Back phone  # [phone]  Permission to leave phone message Yes     Patient questions:  Do you have a fever, pain , or abdominal swelling? No. Pain Score  0 *  Have you tolerated food without any problems? Yes.    Have you been able to return to your normal activities? Yes.    Do you have any questions about your discharge instructions: Diet   No. Medications  No. Follow up visit  No.  Do you have questions or concerns about your Care? No.  Actions: * If pain score is 4 or above: No action needed, pain <4.

## 2022-08-13 ENCOUNTER — Encounter: Payer: Self-pay | Admitting: Gastroenterology

## 2022-09-05 ENCOUNTER — Encounter: Payer: Self-pay | Admitting: Family Medicine

## 2022-09-05 ENCOUNTER — Ambulatory Visit (INDEPENDENT_AMBULATORY_CARE_PROVIDER_SITE_OTHER): Payer: Medicare Other | Admitting: Family Medicine

## 2022-09-05 VITALS — BP 122/76 | HR 81 | Temp 99.0°F | Resp 18 | Ht 64.0 in | Wt 142.4 lb

## 2022-09-05 DIAGNOSIS — J40 Bronchitis, not specified as acute or chronic: Secondary | ICD-10-CM

## 2022-09-05 DIAGNOSIS — R0981 Nasal congestion: Secondary | ICD-10-CM | POA: Diagnosis not present

## 2022-09-05 LAB — POC COVID19 BINAXNOW: SARS Coronavirus 2 Ag: NEGATIVE

## 2022-09-05 MED ORDER — AMOXICILLIN 875 MG PO TABS: 875.0000 mg | ORAL_TABLET | Freq: Two times a day (BID) | ORAL | 0 refills | Status: AC

## 2022-09-05 MED ORDER — GUAIFENESIN-CODEINE 100-10 MG/5ML PO SYRP: 10.0000 mL | ORAL_SOLUTION | Freq: Three times a day (TID) | ORAL | 0 refills | Status: DC | PRN

## 2022-09-05 NOTE — Progress Notes (Signed)
   Subjective:    Patient ID: Sheri Martinez, female    DOB: [birth year], 66 y.o.   MRN: [id number]  HPI Cough- 'it's sinus and it's just pouring down the back of my throat'.  Sxs started 'a couple of days ago'.  No known sick contacts.  + subjective fever last night.  No body aches or chills.  Denies HA.  No ear pain.  + sore throat.  Cough is not productive.  Husband has given her 4 doses of Amoxicillin.  Has not tried any cough medication.  Has not had COVID test.     Review of Systems For ROS see HPI     Objective:   Physical Exam Vitals reviewed.  Constitutional:      General: She is not in acute distress.    Appearance: Normal appearance. She is well-developed. She is not ill-appearing.  HENT:     Head: Normocephalic and atraumatic.     Right Ear: Tympanic membrane and ear canal normal.     Left Ear: Tympanic membrane and ear canal normal.     Nose: Congestion present.     Comments: No TTP over frontal or maxillary sinuses Eyes:     Conjunctiva/sclera: Conjunctivae normal.     Pupils: Pupils are equal, round, and reactive to light.  Cardiovascular:     Rate and Rhythm: Normal rate and regular rhythm.     Heart sounds: Normal heart sounds. No murmur heard. Pulmonary:     Effort: Pulmonary effort is normal. No respiratory distress.     Breath sounds: Normal breath sounds. No wheezing.     Comments: Near continuous hacking cough Musculoskeletal:     Cervical back: Normal range of motion and neck supple.  Lymphadenopathy:     Cervical: No cervical adenopathy.  Skin:    General: Skin is warm and dry.  Neurological:     Mental Status: She is alert. Mental status is at baseline.           Assessment & Plan:  Bronchitis- new.  Pt's deep, hacking cough is consistent w/ bronchitis.  Lungs are CTAB which makes PNA less likely.  Since she already started course of left over Amoxicillin, will complete abxs.  Cough medication prn.  COVID negative.  Reviewed supportive care and red  flags that should prompt return.  Pt expressed understanding and is in agreement w/ plan.

## 2022-09-05 NOTE — Patient Instructions (Addendum)
Follow up as needed or as scheduled Continue the Amoxicillin twice daily- take w/ food Mucinex to help thin congestion Use the codeine cough syrup at night to help w/ cough and sleep Take Robitussin or Delsym for daytime cough Drink plenty of fluids REST! Call with any questions or concerns Hang in there! Happy Holidays!!!

## 2022-10-09 ENCOUNTER — Telehealth: Payer: Self-pay | Admitting: Physician Assistant

## 2022-10-09 NOTE — Telephone Encounter (Signed)
Pt's husband called in stating the pt got a Jury summons in the mail on Friday. He says she is not able to serve on a jury and would need a letter stating that.

## 2022-10-09 NOTE — Telephone Encounter (Signed)
Letter written awaiting Clarise Cruz to sign

## 2022-10-10 ENCOUNTER — Other Ambulatory Visit: Payer: Self-pay | Admitting: Physician Assistant

## 2022-10-11 NOTE — Telephone Encounter (Signed)
Pt's medication was sent to pt's pharmacy, because pt dd not have to be seen for 2 years, confirmation received

## 2022-10-19 ENCOUNTER — Telehealth: Payer: Self-pay | Admitting: Anesthesiology

## 2022-10-19 ENCOUNTER — Telehealth: Payer: Self-pay | Admitting: Family Medicine

## 2022-10-19 NOTE — Telephone Encounter (Signed)
Pt's husband called stating he would like to get some references for overnight home care for his wife. He is her caretaker during the day however sometimes he needs to travel for work. His call back number is [phone].

## 2022-10-19 NOTE — Telephone Encounter (Signed)
(  FYI) Patient husband called to update his wife immunization records.

## 2022-10-19 NOTE — Telephone Encounter (Signed)
Spoke to the pt husband and he advised pt received her RSV and Flu vaccine yesterday 10/18/22 @ [facility] [address] . I did update the chart and called the pharmacy and they are faxing the vaccine information over

## 2022-10-22 NOTE — Telephone Encounter (Signed)
Called and gave referral to [facility] in home care, also [facility]. He understood.

## 2022-10-26 ENCOUNTER — Telehealth: Payer: Self-pay | Admitting: Family Medicine

## 2022-10-26 NOTE — Telephone Encounter (Signed)
Husband called to update his wife immunization. Best contact number is [phone]

## 2022-10-26 NOTE — Telephone Encounter (Signed)
Mr.Roethler called back to speak with Marcille Blanco.

## 2022-10-26 NOTE — Telephone Encounter (Signed)
Left Vm asking to return our call

## 2022-10-26 NOTE — Telephone Encounter (Signed)
Called [facility] [address] and spoke with Caryl Pina she gave me the verbal on Covid vaccine for pt . She state their fax machine is not faxing out today . Pt received a Moderna booster today and will fax once fax machine is up . I did update pt chart till form comes

## 2022-11-05 ENCOUNTER — Telehealth: Payer: Self-pay | Admitting: Family Medicine

## 2022-11-05 ENCOUNTER — Telehealth: Payer: Self-pay | Admitting: Internal Medicine

## 2022-11-05 NOTE — Telephone Encounter (Signed)
Returned husband's call. He stated that patient has been to the pharmacy and received her pneumo vaccine and he wanted to update [place]. I confirmed which pharmacy it was and let him know that I would call them so that they could fax [place] that information as we have to have that anyway to place in our system.

## 2022-11-05 NOTE — Telephone Encounter (Signed)
*  STAT* If patient is at the pharmacy, call can be transferred to refill team.   1. Which medications need to be refilled? (please list name of each medication and dose if known) amLODipine (NORVASC) 5 MG tablet [[id number]]   2. Which pharmacy/location (including street and city if local pharmacy) is medication to be sent to?  [facility] on [address] / [phone]  3. Do they need a 30 day or 90 day supply? 22  Pt  wants to transfer this script to [facility] so they are need a new one called in.

## 2022-11-05 NOTE — Telephone Encounter (Signed)
Husband called to update his wife immunization. Best contact number is [phone]

## 2022-11-06 MED ORDER — AMLODIPINE BESYLATE 5 MG PO TABS: ORAL_TABLET | ORAL | 0 refills | Status: DC

## 2022-11-06 NOTE — Telephone Encounter (Signed)
30 day refill of Amlodipine 5 mg sent to [facility].  Pt is overdue for an appt and note to Pharmacy to please have pt to call and schedule for future refills.

## 2022-11-12 ENCOUNTER — Telehealth: Payer: Self-pay | Admitting: Family Medicine

## 2022-11-12 NOTE — Telephone Encounter (Signed)
Pt's spouse called stating Tet[facility] local [facility].

## 2022-11-12 NOTE — Telephone Encounter (Signed)
Documented

## 2022-12-03 ENCOUNTER — Other Ambulatory Visit: Payer: Self-pay | Admitting: Internal Medicine

## 2022-12-07 ENCOUNTER — Other Ambulatory Visit: Payer: Self-pay | Admitting: Internal Medicine

## 2022-12-20 ENCOUNTER — Ambulatory Visit: Payer: BLUE CROSS/BLUE SHIELD | Admitting: Physician Assistant

## 2022-12-26 ENCOUNTER — Telehealth: Payer: Self-pay | Admitting: Family Medicine

## 2022-12-26 NOTE — Telephone Encounter (Signed)
Contacted Sheri Martinez to schedule their annual wellness visit. Call back at later date: 12/31/2022  Patient has Alzheimer's and husband needs to schedule appointment. He's working on a roof and requested call back.  Thank you,  [place] [facility]  [phone]

## 2023-01-09 ENCOUNTER — Ambulatory Visit (INDEPENDENT_AMBULATORY_CARE_PROVIDER_SITE_OTHER): Payer: Medicare Other | Admitting: Family Medicine

## 2023-01-09 DIAGNOSIS — Z0000 Encounter for general adult medical examination without abnormal findings: Secondary | ICD-10-CM

## 2023-01-09 NOTE — Patient Instructions (Signed)
Sheri Martinez , Thank you for taking time to come for your Medicare Wellness Visit. I appreciate your ongoing commitment to your health goals. Please review the following plan we discussed and let me know if I can assist you in the future.   Screening recommendations/referrals: Colonoscopy: up to date Mammogram: up to date Bone Density: up to date Recommended yearly ophthalmology/optometry visit for glaucoma screening and checkup Recommended yearly dental visit for hygiene and checkup  Vaccinations: Influenza vaccine: up to date Pneumococcal vaccine: up to date Tdap vaccine: up to date Shingles vaccine: Education provided    Advanced directives: on file      Preventive Care 69 Years and Older, Female Preventive care refers to lifestyle choices and visits with your health care provider that can promote health and wellness. What does preventive care include? A yearly physical exam. This is also called an annual well check. Dental exams once or twice a year. Routine eye exams. Ask your health care provider how often you should have your eyes checked. Personal lifestyle choices, including: Daily care of your teeth and gums. Regular physical activity. Eating a healthy diet. Avoiding tobacco and drug use. Limiting alcohol use. Practicing safe sex. Taking low-dose aspirin every day. Taking vitamin and mineral supplements as recommended by your health care provider. What happens during an annual well check? The services and screenings done by your health care provider during your annual well check will depend on your age, overall health, lifestyle risk factors, and family history of disease. Counseling  Your health care provider may ask you questions about your: Alcohol use. Tobacco use. Drug use. Emotional well-being. Home and relationship well-being. Sexual activity. Eating habits. History of falls. Memory and ability to understand (cognition). Work and work  Statistician. Reproductive health. Screening  You may have the following tests or measurements: Height, weight, and BMI. Blood pressure. Lipid and cholesterol levels. These may be checked every 5 years, or more frequently if you are over 35 years old. Skin check. Lung cancer screening. You may have this screening every year starting at age 32 if you have a 30-pack-year history of smoking and currently smoke or have quit within the past 15 years. Fecal occult blood test (FOBT) of the stool. You may have this test every year starting at age 92. Flexible sigmoidoscopy or colonoscopy. You may have a sigmoidoscopy every 5 years or a colonoscopy every 10 years starting at age 41. Hepatitis C blood test. Hepatitis B blood test. Sexually transmitted disease (STD) testing. Diabetes screening. This is done by checking your blood sugar (glucose) after you have not eaten for a while (fasting). You may have this done every 1-3 years. Bone density scan. This is done to screen for osteoporosis. You may have this done starting at age 5. Mammogram. This may be done every 1-2 years. Talk to your health care provider about how often you should have regular mammograms. Talk with your health care provider about your test results, treatment options, and if necessary, the need for more tests. Vaccines  Your health care provider may recommend certain vaccines, such as: Influenza vaccine. This is recommended every year. Tetanus, diphtheria, and acellular pertussis (Tdap, Td) vaccine. You may need a Td booster every 10 years. Zoster vaccine. You may need this after age 6. Pneumococcal 13-valent conjugate (PCV13) vaccine. One dose is recommended after age 50. Pneumococcal polysaccharide (PPSV23) vaccine. One dose is recommended after age 74. Talk to your health care provider about which screenings and vaccines you need and  how often you need them. This information is not intended to replace advice given to you by  your health care provider. Make sure you discuss any questions you have with your health care provider. Document Released: 10/28/2015 Document Revised: 06/20/2016 Document Reviewed: 08/02/2015 Elsevier Interactive Patient Education  2017 [place] Prevention in the Home Falls can cause injuries. They can happen to people of all ages. There are many things you can do to make your home safe and to help prevent falls. What can I do on the outside of my home? Regularly fix the edges of walkways and driveways and fix any cracks. Remove anything that might make you trip as you walk through a door, such as a raised step or threshold. Trim any bushes or trees on the path to your home. Use bright outdoor lighting. Clear any walking paths of anything that might make someone trip, such as rocks or tools. Regularly check to see if handrails are loose or broken. Make sure that both sides of any steps have handrails. Any raised decks and porches should have guardrails on the edges. Have any leaves, snow, or ice cleared regularly. Use sand or salt on walking paths during winter. Clean up any spills in your garage right away. This includes oil or grease spills. What can I do in the bathroom? Use night lights. Install grab bars by the toilet and in the tub and shower. Do not use towel bars as grab bars. Use non-skid mats or decals in the tub or shower. If you need to sit down in the shower, use a plastic, non-slip stool. Keep the floor dry. Clean up any water that spills on the floor as soon as it happens. Remove soap buildup in the tub or shower regularly. Attach bath mats securely with double-sided non-slip rug tape. Do not have throw rugs and other things on the floor that can make you trip. What can I do in the bedroom? Use night lights. Make sure that you have a light by your bed that is easy to reach. Do not use any sheets or blankets that are too big for your bed. They should not hang  down onto the floor. Have a firm chair that has side arms. You can use this for support while you get dressed. Do not have throw rugs and other things on the floor that can make you trip. What can I do in the kitchen? Clean up any spills right away. Avoid walking on wet floors. Keep items that you use a lot in easy-to-reach places. If you need to reach something above you, use a strong step stool that has a grab bar. Keep electrical cords out of the way. Do not use floor polish or wax that makes floors slippery. If you must use wax, use non-skid floor wax. Do not have throw rugs and other things on the floor that can make you trip. What can I do with my stairs? Do not leave any items on the stairs. Make sure that there are handrails on both sides of the stairs and use them. Fix handrails that are broken or loose. Make sure that handrails are as long as the stairways. Check any carpeting to make sure that it is firmly attached to the stairs. Fix any carpet that is loose or worn. Avoid having throw rugs at the top or bottom of the stairs. If you do have throw rugs, attach them to the floor with carpet tape. Make sure that you have  a light switch at the top of the stairs and the bottom of the stairs. If you do not have them, ask someone to add them for you. What else can I do to help prevent falls? Wear shoes that: Do not have high heels. Have rubber bottoms. Are comfortable and fit you well. Are closed at the toe. Do not wear sandals. If you use a stepladder: Make sure that it is fully opened. Do not climb a closed stepladder. Make sure that both sides of the stepladder are locked into place. Ask someone to hold it for you, if possible. Clearly mark and make sure that you can see: Any grab bars or handrails. First and last steps. Where the edge of each step is. Use tools that help you move around (mobility aids) if they are needed. These  include: Canes. Walkers. Scooters. Crutches. Turn on the lights when you go into a dark area. Replace any light bulbs as soon as they burn out. Set up your furniture so you have a clear path. Avoid moving your furniture around. If any of your floors are uneven, fix them. If there are any pets around you, be aware of where they are. Review your medicines with your doctor. Some medicines can make you feel dizzy. This can increase your chance of falling. Ask your doctor what other things that you can do to help prevent falls. This information is not intended to replace advice given to you by your health care provider. Make sure you discuss any questions you have with your health care provider. Document Released: 07/28/2009 Document Revised: 03/08/2016 Document Reviewed: 11/05/2014 Elsevier Interactive Patient Education  2017 [employer].

## 2023-01-09 NOTE — Progress Notes (Addendum)
Subjective:   Sheri Martinez is a 67 y.o. female who presents for Medicare Annual (Subsequent) preventive examination.  I connected with  Sheri Martinez on 01/09/23 by a telephone (with patient husband)  patient has alzheimer enabled telemedicine application and verified that I am speaking with the correct person using two identifiers.   I discussed the limitations of evaluation and management by telemedicine. The patient expressed understanding and agreed to proceed.  Patient location: home  Provider location  telephone/home     Review of Systems     Cardiac Risk Factors include: advanced age (>5men, >41 women);hypertension     Objective:    Today's Vitals   There is no height or weight on file to calculate BMI.     01/09/2023    3:18 PM 06/21/2022    2:24 PM 12/18/2021    2:28 PM 07/03/2021    7:51 AM 12/02/2018   11:33 AM 11/23/2018   11:17 AM 11/11/2015   10:21 AM  Advanced Directives  Does Patient Have a Medical Advance Directive? Yes Yes Yes Yes No No Yes  Type of Paramedic of [place];Living will     Living will  Does patient want to make changes to medical advance directive? Yes (Inpatient - patient requests chaplain consult to change a medical advance directive)      No - Patient declined  Copy of [place] in Chart? Yes - validated most recent copy scanned in chart (See row information)      No - copy requested  Would patient like information on creating a medical advance directive?     No - Patient declined      Current Medications (verified) Outpatient Encounter Medications as of 01/09/2023  Medication Sig   amLODipine (NORVASC) 5 MG tablet TAKE 1 TABLET BY MOUTH DAILY   aspirin EC 81 MG tablet Take 81 mg by mouth daily.   azelastine (ASTELIN) 0.1 % nasal spray Place 1 spray into both nostrils 2 (two) times daily. Use in each nostril as directed   donepezil (ARICEPT) 10 MG tablet Take 10 mg by  mouth daily.   fexofenadine-pseudoephedrine (ALLEGRA-D) 60-120 MG 12 hr tablet Take 1 tablet by mouth 2 (two) times daily.   fluticasone (FLONASE) 50 MCG/ACT nasal spray Place 1 spray into both nostrils as needed.   labetalol (NORMODYNE) 200 MG tablet TAKE 1 TABLET BY MOUTH TWICE DAILY   losartan (COZAAR) 50 MG tablet TAKE 1 TABLET BY MOUTH DAILY   memantine (NAMENDA) 5 MG tablet Take 1 tablet (5 mg at night) for 2 weeks, then increase to 1 tablet (5 mg) twice a day   guaiFENesin-codeine (ROBITUSSIN AC) 100-10 MG/5ML syrup Take 10 mLs by mouth 3 (three) times daily as needed for cough.   No facility-administered encounter medications on file as of 01/09/2023.    Allergies (verified) Penicillins and Sulfamethoxazole   History: Past Medical History:  Diagnosis Date   Abdominal pain, right upper quadrant 06/16/2009   Allergic rhinitis 03/03/2008   Allergy    Bronchitis 10/12/2011   "just got over it'   Chest pain, unspecified 12/07/2009   Chronic gastritis    Complication of anesthesia    Concussion 11/2018   Dehydration, mild 05/09/2012   Diarrhea, infectious, adult 05/09/2012   Dysrhythmia    palpitation   Essential hypertension 03/03/2008   Generalized anxiety disorder    GERD (gastroesophageal reflux disease)    Hemorrhoids 06/16/2009   Hiatal hernia  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 11/09/2021   Myalgia 04/04/2010   Nausea 06/16/2009   Palpitations 02/02/2009   Pneumonia 10/12/2011   "just got over it"   PONV (postoperative nausea and vomiting)    S/P left knee arthroscopy 05/04/2013   Shortness of breath 10/12/2011   "at any time; can't relate it to anything; here w/chest tightness; just got over pneumonia/bronchitis"   Past Surgical History:  Procedure Laterality Date   ABDOMINAL HYSTERECTOMY  ~ 1989   TAH; BSO; due to endometriosis   CHOLECYSTECTOMY  02/13/2011   COLONOSCOPY     DILATION AND CURETTAGE OF UTERUS  06/16/1979    NASAL SEPTUM SURGERY  06/15/1989   OOPHORECTOMY     (tah/bso) (off estrogen 2005)   Family History  Problem Relation Age of Onset   Colon cancer Mother    Hypertension Mother    Heart disease Other        grandparent   Ovarian cancer Other        aunt   Crohn's disease Neg Hx    Esophageal cancer Neg Hx    Rectal cancer Neg Hx    Stomach cancer Neg Hx    Ulcerative colitis Neg Hx    Social History   Socioeconomic History   Marital status: Married    Spouse name: Not on file   Number of children: 2   Years of education: 12   Highest education level: High school graduate  Occupational History   Occupation: Retired    Fish farm manager: [employer]    Comment: HR support  Tobacco Use   Smoking status: Former    Packs/day: 0.50    Years: 3.00    Additional pack years: 0.00    Total pack years: 1.50    Types: Cigarettes    Quit date: 12/22/1978    Years since quitting: 44.0    Passive exposure: Never   Smokeless tobacco: Never  Vaping Use   Vaping Use: Never used  Substance and Sexual Activity   Alcohol use: No   Drug use: No   Sexual activity: Yes  Other Topics Concern   Not on file  Social History Narrative   Daily caffeine   One story home   Right handed   Social Determinants of Health   Financial Resource Strain: Low Risk  (01/09/2023)   Overall Financial Resource Strain (CARDIA)    Difficulty of Paying Living Expenses: Not hard at all  Food Insecurity: No Food Insecurity (01/09/2023)   Hunger Vital Sign    Worried About Running Out of Food in the Last Year: Never true    Ran Out of Food in the Last Year: Never true  Transportation Needs: No Transportation Needs (01/09/2023)   PRAPARE - Hydrologist (Medical): No    Lack of Transportation (Non-Medical): No  Physical Activity: Insufficiently Active (01/09/2023)   Exercise Vital Sign    Days of Exercise per Week: 3 days    Minutes of Exercise per Session: 20 min  Stress: Stress Concern Present  (01/09/2023)   [place]    Feeling of Stress : Very much  Social Connections: Socially Isolated (01/09/2023)   Social Connection and Isolation Panel [NHANES]    Frequency of Communication with Friends and Family: Once a week    Frequency of Social Gatherings with Friends and Family: Once a week    Attends Religious Services: Never    Active Member  of Clubs or Organizations: No    Attends Archivist Meetings: Never    Marital Status: Married    Tobacco Counseling Counseling given: Not Answered   Clinical Intake:  Pre-visit preparation completed: Yes  Pain : No/denies pain     Diabetes: No  How often do you need to have someone help you when you read instructions, pamphlets, or other written materials from your doctor or pharmacy?: 5 - Always  Diabetic? no  Interpreter Needed?: No  Information entered by :: Leroy Kennedy LPN   Activities of Daily Living    01/09/2023    3:26 PM  In your present state of health, do you have any difficulty performing the following activities:  Hearing? 0  Vision? 0  Difficulty concentrating or making decisions? 0  Walking or climbing stairs? 0  Dressing or bathing? 0  Doing errands, shopping? 0  Preparing Food and eating ? N  Using the Toilet? N  In the past six months, have you accidently leaked urine? N  Do you have problems with loss of bowel control? N  Managing your Medications? Y  Managing your Finances? Y  Housekeeping or managing your Housekeeping? Y    Patient Care Team: Wendie Agreste, MD as PCP - General (Family Medicine)  Indicate any recent Medical Services you may have received from other than [facility] providers in the past year (date may be approximate).     Assessment:   This is a routine wellness examination for [place].  Hearing/Vision screen Hearing Screening - Comments:: No trouble hearing Vision Screening - Comments:: Up to  date [place]  Dietary issues and exercise activities discussed: Current Exercise Habits: Home exercise routine, Type of exercise: walking, Time (Minutes): 20, Frequency (Times/Week): 3, Weekly Exercise (Minutes/Week): 60, Intensity: Mild   Goals Addressed             This Visit's Progress    Patient Stated       No goals       Depression Screen    01/09/2023    3:16 PM 09/05/2022    2:30 PM 06/14/2021    8:19 AM  PHQ 2/9 Scores  PHQ - 2 Score 4 0 0  PHQ- 9 Score 5 0 0    Fall Risk    01/09/2023    3:19 PM 09/05/2022    2:31 PM 06/21/2022    2:24 PM 06/07/2022    3:38 PM 12/18/2021    2:28 PM  [place] in the past year? 0 0 0 0 0  Number falls in past yr: 0  0 0 0  Injury with Fall? 0  0 0 0  Risk for fall due to :  No Fall Risks  No Fall Risks   Follow up Falls evaluation completed;Education provided;Falls prevention discussed Falls evaluation completed  Falls evaluation completed     FALL RISK PREVENTION PERTAINING TO THE HOME:  Any stairs in or around the home? Yes  If so, are there any without handrails? No  Home free of loose throw rugs in walkways, pet beds, electrical cords, etc? Yes  Adequate lighting in your home to reduce risk of falls? Yes   ASSISTIVE DEVICES UTILIZED TO PREVENT FALLS:  Life alert? No  Use of a cane, walker or w/c? No  Grab bars in the bathroom? No  Shower chair or bench in shower? Yes  Elevated toilet seat or a handicapped toilet? No   TIMED UP AND GO:  Was  the test performed? No .    Cognitive Function:   PATIENT REFUSED      06/21/2022    6:00 PM  MMSE - Mini Mental State Exam  Orientation to time 0  Orientation to Place 5  Registration 3  Attention/ Calculation 5  Recall 0  Language- name 2 objects 2  Language- repeat 1  Language- follow 3 step command 3  Language- read & follow direction 1  Write a sentence 1  Copy design 1  Total score 22      07/03/2021    8:00 AM  Montreal Cognitive Assessment    Visuospatial/ Executive (0/5) 5  Naming (0/3) 3  Attention: Read list of digits (0/2) 2  Attention: Read list of letters (0/1) 1  Attention: Serial 7 subtraction starting at 100 (0/3) 1  Language: Repeat phrase (0/2) 2  Language : Fluency (0/1) 1  Abstraction (0/2) 1  Delayed Recall (0/5) 0  Orientation (0/6) 2  Total 18  Adjusted Score (based on education) 19      Immunizations Immunization History  Administered Date(s) Administered   Fluad Quad(high Dose 65+) 10/18/2022   Influenza-Unspecified 10/18/2022   Moderna SARS-COV2 Booster Vaccination 10/26/2022   Moderna Sars-Covid-2 Vaccination 10/26/2022   PFIZER(Purple Top)SARS-COV-2 Vaccination 12/05/2019, 12/29/2019, 08/13/2020   PNEUMOCOCCAL CONJUGATE-20 11/03/2022   Respiratory Syncytial Virus Vaccine,Recomb Aduvanted(Arexvy) 10/18/2022   Tdap 11/10/2022    TDAP status: Up to date  Flu Vaccine status: Up to date  Pneumococcal vaccine status: Up to date  Covid-19 vaccine status: Information provided on how to obtain vaccines.   Qualifies for Shingles Vaccine? Yes   Zostavax completed No   Shingrix Completed?: No.    Education has been provided regarding the importance of this vaccine. Patient has been advised to call insurance company to determine out of pocket expense if they have not yet received this vaccine. Advised may also receive vaccine at local pharmacy or Health Dept. Verbalized acceptance and understanding.  Screening Tests Health Maintenance  Topic Date Due   Zoster Vaccines- Shingrix (1 of 2) 04/11/2023 (Originally 04/30/2004)   Hepatitis C Screening  06/08/2023 (Originally [birth year]1972)   Medicare Annual Wellness (AWV)  01/09/2024   MAMMOGRAM  05/17/2024   COLONOSCOPY (Pts 45-28yrs Insurance coverage will need to be confirmed)  07/31/2029   DTaP/Tdap/Td (2 - Td or Tdap) 11/10/2032   Pneumonia Vaccine 53+ Years old  Completed   INFLUENZA VACCINE  Completed   DEXA SCAN  Completed   HPV VACCINES  Aged Out    COVID-19 Vaccine  Discontinued    Health Maintenance  There are no preventive care reminders to display for this patient.   Colorectal cancer screening: Type of screening: Colonoscopy. Completed 2023. Repeat every 7 years  Mammogram status: Completed 2023. Repeat every year  Bone Density status: Completed 2023. Results reflect: Bone density results: OSTEOPENIA. Repeat every 2 years.  Lung Cancer Screening: (Low Dose CT Chest recommended if Age 3-80 years, 30 pack-year currently smoking OR have quit w/in 15years.) does not qualify.   Lung Cancer Screening Referral:   Additional Screening:  Hepatitis C Screening: does qualify  Vision Screening: Recommended annual ophthalmology exams for early detection of glaucoma and other disorders of the eye. Is the patient up to date with their annual eye exam?  Yes  Who is the provider or what is the name of the office in which the patient attends annual eye exams? [facility] If pt is not established with a provider, would they like to be  referred to a provider to establish care? No .   Dental Screening: Recommended annual dental exams for proper oral hygiene  Community Resource Referral / Chronic Care Management: CRR required this visit?  No   CCM required this visit?  No      Plan:     I have personally reviewed and noted the following in the patient's chart:   Medical and social history Use of alcohol, tobacco or illicit drugs  Current medications and supplements including opioid prescriptions. Patient is not currently taking opioid prescriptions. Functional ability and status Nutritional status Physical activity Advanced directives List of other physicians Hospitalizations, surgeries, and ER visits in previous 12 months Vitals Screenings to include cognitive, depression, and falls Referrals and appointments  In addition, I have reviewed and discussed with patient certain preventive protocols, quality metrics, and best  practice recommendations. A written personalized care plan for preventive services as well as general preventive health recommendations were provided to patient.     Leroy Kennedy, LPN   [id number]   Nurse Notes:   Patients husband answered all questions  patient did not want to do visit.  No Cognitive test done.     He states she still drives, but only 2 miles down road, does not drive at night.  He is still working so she is home during the day, but has sundowners and will start calling him around 4 if he isn't home.    Husband also stated she does pull away from attending any social activities

## 2023-01-15 NOTE — Progress Notes (Signed)
Note reviewed, and agree with documentation and plan. I was available in office during the time of this wellness visit, or available by phone or text if needed and available for questions or concerns regarding visit or follow-up issues to be addressed.  Signed,   Merri Ray, MD [place], [facility] 01/15/23 9:01 PM

## 2023-01-29 ENCOUNTER — Other Ambulatory Visit: Payer: Self-pay

## 2023-01-29 MED ORDER — DONEPEZIL HCL 10 MG PO TABS: 10.0000 mg | ORAL_TABLET | Freq: Every day | ORAL | 1 refills | Status: DC

## 2023-02-01 ENCOUNTER — Other Ambulatory Visit: Payer: Self-pay | Admitting: Physician Assistant

## 2023-02-17 ENCOUNTER — Other Ambulatory Visit: Payer: Self-pay | Admitting: Internal Medicine

## 2023-02-21 ENCOUNTER — Telehealth: Payer: Self-pay | Admitting: Physician Assistant

## 2023-02-21 ENCOUNTER — Other Ambulatory Visit: Payer: Self-pay | Admitting: Physician Assistant

## 2023-02-21 NOTE — Telephone Encounter (Signed)
I called [facility] to release another rx per sara for the namenda 5mg  bid

## 2023-02-21 NOTE — Telephone Encounter (Signed)
Pt's husband called in stating he got the pt's memantine from the pharmacy recently, but he has misplaced that bottle. The pharmacy says they cannot give anymore. He is wondering what he can do in this situation? She has about a weeks worth left.

## 2023-03-04 ENCOUNTER — Other Ambulatory Visit: Payer: Self-pay | Admitting: *Deleted

## 2023-03-04 MED ORDER — LABETALOL HCL 200 MG PO TABS: ORAL_TABLET | ORAL | 0 refills | Status: DC

## 2023-03-07 ENCOUNTER — Ambulatory Visit: Payer: Medicare Other | Admitting: Physician Assistant

## 2023-03-24 ENCOUNTER — Other Ambulatory Visit: Payer: Self-pay | Admitting: Physician Assistant

## 2023-03-28 NOTE — Progress Notes (Signed)
  Electrophysiology Office Note:   Date:  03/29/2023  ID:  Sheri Martinez, DOB 15-Jul-1956, MRN [id number]  Primary Cardiologist: None Electrophysiologist: Lewayne Bunting, MD      History of Present Illness:   Sheri Martinez is a 67 y.o. female with h/o palpitations and HTN seen today for routine electrophysiology followup.   Last seen in EP clinic 08/2020.  Since last being seen in our clinic the patient reports doing very well. No further palpitations.  she denies chest pain, dyspnea, PND, orthopnea, nausea, vomiting, dizziness, syncope, edema, weight gain, or early satiety.   Review of systems complete and found to be negative unless listed in HPI.   Studies Reviewed:    EKG is ordered today. Personal review shows NSR at 66 bpm  Physical Exam:   VS:  BP 136/74   Pulse 66   Ht 5\' 4"  (1.626 m)   Wt 138 lb 9.6 oz (62.9 kg)   SpO2 99%   BMI 23.79 kg/m    Wt Readings from Last 3 Encounters:  03/29/23 138 lb 9.6 oz (62.9 kg)  09/05/22 142 lb 6 oz (64.6 kg)  07/31/22 148 lb 3.2 oz (67.2 kg)     GEN: Well nourished, well developed in no acute distress NECK: No JVD; No carotid bruits CARDIAC: Regular rate and rhythm, no murmurs, rubs, gallops RESPIRATORY:  Clear to auscultation without rales, wheezing or rhonchi  ABDOMEN: Soft, non-tender, non-distended EXTREMITIES:  No edema; No deformity   ASSESSMENT AND PLAN:    Palpitations EKG today shows NSR Offered to see as needed, pt would prefer to see annually to keep her medications as they are currently prescribed.   HTN Stable on current regimen    Follow up with Dr. Ladona Ridgel in 12 months, or PRN if pt decides to transfer meds to her PCP.   Signed, Graciella Freer, PA-C

## 2023-03-29 ENCOUNTER — Encounter: Payer: Self-pay | Admitting: Student

## 2023-03-29 ENCOUNTER — Ambulatory Visit: Payer: Medicare Other | Attending: Student | Admitting: Student

## 2023-03-29 VITALS — BP 136/74 | HR 66 | Ht 64.0 in | Wt 138.6 lb

## 2023-03-29 DIAGNOSIS — I10 Essential (primary) hypertension: Secondary | ICD-10-CM | POA: Diagnosis present

## 2023-03-29 DIAGNOSIS — R002 Palpitations: Secondary | ICD-10-CM | POA: Diagnosis present

## 2023-03-29 MED ORDER — LOSARTAN POTASSIUM 50 MG PO TABS: ORAL_TABLET | ORAL | 3 refills | Status: DC

## 2023-03-29 MED ORDER — AMLODIPINE BESYLATE 5 MG PO TABS: 5.0000 mg | ORAL_TABLET | Freq: Every day | ORAL | 3 refills | Status: DC

## 2023-03-29 MED ORDER — LABETALOL HCL 200 MG PO TABS: ORAL_TABLET | ORAL | 3 refills | Status: DC

## 2023-03-29 NOTE — Patient Instructions (Signed)
Medication Instructions:  Your physician recommends that you continue on your current medications as directed. Please refer to the Current Medication list given to you today.  *If you need a refill on your cardiac medications before your next appointment, please call your pharmacy*  Lab Work: None ordered If you have labs (blood work) drawn today and your tests are completely normal, you will receive your results only by: MyChart Message (if you have MyChart) OR A paper copy in the mail If you have any lab test that is abnormal or we need to change your treatment, we will call you to review the results.  Follow-Up: At [facility], you and your health needs are our priority.  As part of our continuing mission to provide you with exceptional heart care, we have created designated Provider Care Teams.  These Care Teams include your primary Cardiologist (physician) and Advanced Practice Providers (APPs -  Physician Assistants and Nurse Practitioners) who all work together to provide you with the care you need, when you need it.  Your next appointment:   1 year(s)  Provider:   Gregg Taylor, MD  

## 2023-03-30 ENCOUNTER — Other Ambulatory Visit: Payer: Self-pay | Admitting: Internal Medicine

## 2023-03-31 ENCOUNTER — Other Ambulatory Visit: Payer: Self-pay | Admitting: Internal Medicine

## 2023-05-07 ENCOUNTER — Ambulatory Visit: Payer: Medicare Other | Admitting: Physician Assistant

## 2023-05-20 ENCOUNTER — Other Ambulatory Visit: Payer: Self-pay | Admitting: Physician Assistant

## 2023-06-12 ENCOUNTER — Other Ambulatory Visit: Payer: Self-pay | Admitting: Physician Assistant

## 2023-06-26 ENCOUNTER — Telehealth: Payer: Self-pay | Admitting: Physician Assistant

## 2023-06-26 ENCOUNTER — Other Ambulatory Visit: Payer: Self-pay

## 2023-06-26 ENCOUNTER — Other Ambulatory Visit: Payer: Self-pay | Admitting: Physician Assistant

## 2023-06-26 MED ORDER — MEMANTINE HCL 5 MG PO TABS: ORAL_TABLET | ORAL | 11 refills | Status: DC

## 2023-06-26 NOTE — Telephone Encounter (Signed)
Patient's husband called stating that the patient is needing refills on MEMatine 5mg  sent to [facility] on [address] , patient did make an appointment for November and I added her to the wait list

## 2023-07-15 ENCOUNTER — Other Ambulatory Visit: Payer: Self-pay | Admitting: Physician Assistant

## 2023-08-22 ENCOUNTER — Ambulatory Visit (INDEPENDENT_AMBULATORY_CARE_PROVIDER_SITE_OTHER): Payer: Medicare Other | Admitting: Physician Assistant

## 2023-08-22 ENCOUNTER — Encounter: Payer: Self-pay | Admitting: Physician Assistant

## 2023-08-22 VITALS — BP 146/75 | HR 74 | Resp 20 | Ht 64.0 in | Wt 133.0 lb

## 2023-08-22 DIAGNOSIS — G309 Alzheimer's disease, unspecified: Secondary | ICD-10-CM

## 2023-08-22 DIAGNOSIS — F0280 Dementia in other diseases classified elsewhere without behavioral disturbance: Secondary | ICD-10-CM

## 2023-08-22 MED ORDER — MEMANTINE HCL 10 MG PO TABS: ORAL_TABLET | ORAL | 3 refills | Status: DC

## 2023-08-22 MED ORDER — DONEPEZIL HCL 10 MG PO TABS: 10.0000 mg | ORAL_TABLET | Freq: Every day | ORAL | 3 refills | Status: DC
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
## 2023-08-22 NOTE — Patient Instructions (Addendum)
It was a pleasure to see you today at our office.   Recommendations:  Follow up in 6  months Continue donepezil 10 mg daily Increase memantine to 10 mg  2 times a day if tolerated Recommend Silver Sneakers exercise program  Start B12 supplements   Whom to call:  Memory  decline, memory medications: Call our office [phone]   For psychiatric meds, mood meds: Please have your primary care physician manage these medications.   Counseling regarding caregiver distress, including caregiver depression, anxiety and issues regarding community resources, adult day care programs, adult living facilities, or memory care questions:   Feel free to contact Misty Lisabeth Register, Social Worker at [phone]   For assessment of decision of mental capacity and competency:  Call Dr. Erick Blinks, geriatric psychiatrist at [phone]  For guidance in geriatric dementia issues please call [facility] [phone]  For guidance regarding [facility] and if placement were needed at the facility, contact Sidney Ace, Social Worker tel: [phone]  If you have any severe symptoms of a stroke, or other severe issues such as confusion,severe chills or fever, etc call 911 or go to the ER as you may need to be evaluated further   Feel free to visit Facebook page " [employer]" for tips of how to care for people with memory problems.         RECOMMENDATIONS FOR ALL PATIENTS WITH MEMORY PROBLEMS: 1. Continue to exercise (Recommend 30 minutes of walking everyday, or 3 hours every week) 2. Increase social interactions - continue going to [place] and enjoy social gatherings with friends and family 3. Eat healthy, avoid fried foods and eat more fruits and vegetables 4. Maintain adequate blood pressure, blood sugar, and blood cholesterol level. Reducing the risk of stroke and cardiovascular disease also helps promoting better memory. 5. Avoid stressful situations. Live a  simple life and avoid aggravations. Organize your time and prepare for the next day in anticipation. 6. Sleep well, avoid any interruptions of sleep and avoid any distractions in the bedroom that may interfere with adequate sleep quality 7. Avoid sugar, avoid sweets as there is a strong link between excessive sugar intake, diabetes, and cognitive impairment We discussed the Mediterranean diet, which has been shown to help patients reduce the risk of progressive memory disorders and reduces cardiovascular risk. This includes eating fish, eat fruits and green leafy vegetables, nuts like almonds and hazelnuts, walnuts, and also use olive oil. Avoid fast foods and fried foods as much as possible. Avoid sweets and sugar as sugar use has been linked to worsening of memory function.  There is always a concern of gradual progression of memory problems. If this is the case, then we may need to adjust level of care according to patient needs. Support, both to the patient and caregiver, should then be put into place.    FALL PRECAUTIONS: Be cautious when walking. Scan the area for obstacles that may increase the risk of trips and falls. When getting up in the mornings, sit up at the edge of the bed for a few minutes before getting out of bed. Consider elevating the bed at the head end to avoid drop of blood pressure when getting up. Walk always in a well-lit room (use night lights in the walls). Avoid area rugs or power cords from appliances in the middle of the walkways. Use a walker or a cane if necessary and consider physical therapy for balance exercise. Get your eyesight checked regularly.  FINANCIAL OVERSIGHT: Supervision, especially oversight when making financial decisions or transactions is also recommended.  HOME SAFETY: Consider the safety of the kitchen when operating appliances like stoves, microwave oven, and blender. Consider having supervision and share cooking responsibilities until no longer able  to participate in those. Accidents with firearms and other hazards in the house should be identified and addressed as well.   ABILITY TO BE LEFT ALONE: If patient is unable to contact 911 operator, consider using LifeLine, or when the need is there, arrange for someone to stay with patients. Smoking is a fire hazard, consider supervision or cessation. Risk of wandering should be assessed by caregiver and if detected at any point, supervision and safe proof recommendations should be instituted.  MEDICATION SUPERVISION: Inability to self-administer medication needs to be constantly addressed. Implement a mechanism to ensure safe administration of the medications.   DRIVING: Regarding driving, in patients with progressive memory problems, driving will be impaired. We advise to have someone else do the driving if trouble finding directions or if minor accidents are reported. Independent driving assessment is available to determine safety of driving.   If you are interested in the driving assessment, you can contact the following:  [employer] in [place] [phone]  [facility] [phone]  [facility] [phone]  [facility] [phone] or [phone]

## 2024-02-19 ENCOUNTER — Ambulatory Visit: Payer: Medicare Other | Admitting: Physician Assistant

## 2024-03-10 ENCOUNTER — Ambulatory Visit: Admitting: Physician Assistant

## 2024-03-18 ENCOUNTER — Other Ambulatory Visit: Payer: Self-pay | Admitting: Student

## 2024-04-14 ENCOUNTER — Other Ambulatory Visit: Payer: Self-pay | Admitting: Student

## 2024-04-20 ENCOUNTER — Encounter: Payer: Self-pay | Admitting: Physician Assistant

## 2024-04-21 NOTE — Progress Notes (Signed)
 Assessment/Plan:   Dementia likely due to Alzheimer disease  Sheri Martinez is a very pleasant 68 y.o. RH female with a history of dementia lately due to Alzheimer's disease seen today in follow up for memory loss. Patient is currently on donepezil  10 mg daily.  Cognitive decline is noted, discussed starting memantine  5 mg twice daily with goal of 10 mg twice daily for better coverage.  Her husband s going to look into good Rx cards to obtain to better price for the medicinePatient is able to participate on ADLs and to drive short distances.    Follow up in 6 months. Continue donepezil  10 mg daily, side effects discussed Recommend starting memantine  5 mg twice daily, with goal of 10 mg twice daily sedated, side effects of 5 Recommend good control of her cardiovascular risk factors Continue to control mood as per PCP Monitor driving     Subjective:    This patient is accompanied in the office by her husband who supplements the history.  Previous records as well as any outside records available were reviewed prior to todays visit. Patient was last seen on 08/22/2023    Any changes in memory since last visit? Worse, she has little STM and does not function well doing basic functions. She stares off in space a lot. Her world is a circle of 2 miles from our home repeats oneself?  Endorsed Disoriented when walking into a room? Denies    Leaving objects?  May misplace things but not in unusual places   Wandering behavior?  Denies. 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 Any personality changes since last visit?  She may get frustrated at times and lashes out, mainly at her sister-daughter says. She will have no social contact, will not  travel to even see her grandkids in [place].They come here unannounced so that she can handle it.  Any worsening depression?:  Denies.   Hallucinations or paranoia?  Denies.   Seizures? denies    Any sleep changes? Sleeps  Denies vivid dreams, REM behavior or sleepwalking   Sleep apnea?   Denies.   Any hygiene concerns? She often refuses to shower or change clothes. Independent of bathing and dressing?  Endorsed  Does the patient needs help with medications?  Husband and sister is in charge  Who is in charge of the finances? Husband is in charge    Any changes in appetite?  denies    Patient have trouble swallowing? Denies.   Does the patient cook? No Any headaches?   denies   Any vision changes?  Denies Chronic back pain  denies   Ambulates with difficulty? Denies.    Recent falls or head injuries? Denies.     Unilateral weakness, numbness or tingling? denies   Any tremors?  Denies    Any anosmia?  Denies   Any incontinence of urine?  Endorsed    Any bowel dysfunction?   Denies      Patient lives with husband  does the patient drive? She still drives but only 1.5 miles to breakfast or to [employer] about 3 times a week. Daughter is concerned that she will badly regress if they take the keys away.Hoping  she will do this on her own.     PREVIOUS MEDICATIONS:   CURRENT MEDICATIONS:  Outpatient Encounter Medications as of 04/22/2024  Medication Sig   amLODipine  (NORVASC ) 5 MG tablet TAKE 1 TABLET BY MOUTH DAILY - KEEP APPOINTMENT IN MAY FOR FUTUR REFILLS   aspirin  EC 81 MG tablet Take 81 mg by mouth daily.   donepezil  (ARICEPT ) 10 MG tablet Take 1 tablet (10 mg total) by mouth daily.   fexofenadine-pseudoephedrine (ALLEGRA-D) 60-120 MG 12 hr tablet Take 1 tablet by mouth 2 (two) times daily.   labetalol  (NORMODYNE ) 200 MG tablet TAKE 1 TABLET BY MOUTH 2 TIMES A DAY   losartan  (COZAAR ) 50 MG tablet TAKE 1 TABLET BY MOUTH DAILY   memantine  (NAMENDA ) 10 MG tablet Take 1 tablet   twice a day   azelastine  (ASTELIN ) 0.1 % nasal spray Place 1 spray into both nostrils 2 (two) times daily. Use in each nostril as directed   No facility-administered encounter medications on file as of 04/22/2024.       06/21/2022    6:00 PM  MMSE - Mini Mental State Exam  Orientation to time 0  Orientation to Place 5  Registration 3  Attention/ Calculation 5  Recall 0  Language- name 2 objects 2  Language- repeat 1  Language- follow 3 step command 3  Language- read & follow direction 1  Write a sentence 1  Copy design 1  Total score 22      07/03/2021    8:00 AM  Montreal Cognitive Assessment   Visuospatial/ Executive (0/5) 5  Naming (0/3) 3  Attention: Read list of digits (0/2) 2  Attention: Read list of letters (0/1) 1  Attention: Serial 7 subtraction starting at 100 (0/3) 1  Language: Repeat phrase (0/2) 2  Language : Fluency (0/1) 1  Abstraction (0/2) 1  Delayed Recall (0/5) 0  Orientation (0/6) 2  Total 18  Adjusted Score (based on education) 19    Objective:     PHYSICAL EXAMINATION:    VITALS:   Vitals:   04/22/24 1446  BP: (!) 162/83  Pulse: 69  Resp: 20  SpO2: 95%  Weight: 136 lb (61.7 kg)  Height: 5' 4 (1.626 m)    GEN:  The patient appears stated age and is in NAD. HEENT:  Normocephalic, atraumatic.   Neurological examination:  General: NAD, well-groomed, appears stated age. Orientation: The patient is alert. Oriented to person, place and date Cranial nerves: There is good facial symmetry.The speech is fluent and clear. No aphasia or dysarthria. Fund of knowledge is appropriate. Recent and remote memory are impaired. Attention and concentration are reduced. Able to name objects and unable repeat phrases.  Hearing is intact to conversational tone.   Sensation: Sensation is intact to light touch throughout Motor: Strength is at least antigravity x4. DTR's 2/4 in UE/LE     Movement examination: Tone: There is normal tone in the  UE/LE Abnormal movements:  no tremor.  No myoclonus.  No asterixis.   Coordination:  There is no decremation with RAM's. Normal finger to nose  Gait and Station: The patient has no  difficulty arising out of a deep-seated chair without the use of the hands. The patient's stride length is good.  Gait is cautious and narrow.    Thank you for allowing us  the opportunity to participate in the care of this nice patient. Please do not hesitate to contact us  for any questions or concerns.   Total time spent  on today's visit was 24 minutes dedicated to this patient today, preparing to see patient, examining the patient, ordering tests and/or medications and counseling the patient, documenting clinical information in the EHR or other health record, independently interpreting results and communicating results to the patient/family, discussing treatment and goals, answering patient's questions and coordinating care.  Cc:  Levora Reyes SAUNDERS, MD  Camie Sevin 04/22/2024 4:58 PM

## 2024-04-22 ENCOUNTER — Encounter: Payer: Self-pay | Admitting: Physician Assistant

## 2024-04-22 ENCOUNTER — Ambulatory Visit (INDEPENDENT_AMBULATORY_CARE_PROVIDER_SITE_OTHER): Admitting: Physician Assistant

## 2024-04-22 VITALS — BP 162/83 | HR 69 | Resp 20 | Ht 64.0 in | Wt 136.0 lb

## 2024-04-22 DIAGNOSIS — F0280 Dementia in other diseases classified elsewhere without behavioral disturbance: Secondary | ICD-10-CM

## 2024-04-22 DIAGNOSIS — G309 Alzheimer's disease, unspecified: Secondary | ICD-10-CM

## 2024-04-22 NOTE — Patient Instructions (Signed)
 It was a pleasure to see you today at our office.   Recommendations:  Follow up in 6  months Continue donepezil  10 mg daily Increase memantine  to 10 mg  2 times a day if tolerated Recommend Silver Sneakers exercise program     Whom to call:  Memory  decline, memory medications: Call our office [phone]   For psychiatric meds, mood meds: Please have your primary care physician manage these medications.   Counseling regarding caregiver distress, including caregiver depression, anxiety and issues regarding community resources, adult day care programs, adult living facilities, or memory care questions:   Feel free to contact Misty Waddell Simmer, Social Worker at [phone]   For assessment of decision of mental capacity and competency:  Call Dr. Rosaline Nine, geriatric psychiatrist at [phone]  For guidance in geriatric dementia issues please call [facility] [phone]  For guidance regarding [facility] and if placement were needed at the facility, contact Nat Hock, Social Worker tel: [phone]  If you have any severe symptoms of a stroke, or other severe issues such as confusion,severe chills or fever, etc call 911 or go to the ER as you may need to be evaluated further   Feel free to visit Facebook page  [employer] for tips of how to care for people with memory problems.         RECOMMENDATIONS FOR ALL PATIENTS WITH MEMORY PROBLEMS: 1. Continue to exercise (Recommend 30 minutes of walking everyday, or 3 hours every week) 2. Increase social interactions - continue going to [place] and enjoy social gatherings with friends and family 3. Eat healthy, avoid fried foods and eat more fruits and vegetables 4. Maintain adequate blood pressure, blood sugar, and blood cholesterol level. Reducing the risk of stroke and cardiovascular disease also helps promoting better memory. 5. Avoid stressful situations. Live a simple life and avoid  aggravations. Organize your time and prepare for the next day in anticipation. 6. Sleep well, avoid any interruptions of sleep and avoid any distractions in the bedroom that may interfere with adequate sleep quality 7. Avoid sugar, avoid sweets as there is a strong link between excessive sugar intake, diabetes, and cognitive impairment We discussed the Mediterranean diet, which has been shown to help patients reduce the risk of progressive memory disorders and reduces cardiovascular risk. This includes eating fish, eat fruits and green leafy vegetables, nuts like almonds and hazelnuts, walnuts, and also use olive oil. Avoid fast foods and fried foods as much as possible. Avoid sweets and sugar as sugar use has been linked to worsening of memory function.  There is always a concern of gradual progression of memory problems. If this is the case, then we may need to adjust level of care according to patient needs. Support, both to the patient and caregiver, should then be put into place.    FALL PRECAUTIONS: Be cautious when walking. Scan the area for obstacles that may increase the risk of trips and falls. When getting up in the mornings, sit up at the edge of the bed for a few minutes before getting out of bed. Consider elevating the bed at the head end to avoid drop of blood pressure when getting up. Walk always in a well-lit room (use night lights in the walls). Avoid area rugs or power cords from appliances in the middle of the walkways. Use a walker or a cane if necessary and consider physical therapy for balance exercise. Get your eyesight checked regularly.  FINANCIAL OVERSIGHT:  Supervision, especially oversight when making financial decisions or transactions is also recommended.  HOME SAFETY: Consider the safety of the kitchen when operating appliances like stoves, microwave oven, and blender. Consider having supervision and share cooking responsibilities until no longer able to participate in  those. Accidents with firearms and other hazards in the house should be identified and addressed as well.   ABILITY TO BE LEFT ALONE: If patient is unable to contact 911 operator, consider using LifeLine, or when the need is there, arrange for someone to stay with patients. Smoking is a fire hazard, consider supervision or cessation. Risk of wandering should be assessed by caregiver and if detected at any point, supervision and safe proof recommendations should be instituted.  MEDICATION SUPERVISION: Inability to self-administer medication needs to be constantly addressed. Implement a mechanism to ensure safe administration of the medications.   DRIVING: Regarding driving, in patients with progressive memory problems, driving will be impaired. We advise to have someone else do the driving if trouble finding directions or if minor accidents are reported. Independent driving assessment is available to determine safety of driving.   If you are interested in the driving assessment, you can contact the following:  [facility] [employer] [facility] in [place] [phone]  [facility] [phone]  [facility] [phone]  [facility] [phone] or [phone]

## 2024-05-04 ENCOUNTER — Telehealth: Payer: Self-pay | Admitting: Internal Medicine

## 2024-05-04 MED ORDER — LABETALOL HCL 200 MG PO TABS: 200.0000 mg | ORAL_TABLET | Freq: Two times a day (BID) | ORAL | 0 refills | Status: DC

## 2024-05-04 MED ORDER — AMLODIPINE BESYLATE 5 MG PO TABS: 5.0000 mg | ORAL_TABLET | Freq: Every day | ORAL | 0 refills | Status: DC

## 2024-05-04 MED ORDER — LOSARTAN POTASSIUM 50 MG PO TABS: 50.0000 mg | ORAL_TABLET | Freq: Every day | ORAL | 0 refills | Status: DC

## 2024-05-04 NOTE — Telephone Encounter (Signed)
*  STAT* If patient is at the pharmacy, call can be transferred to refill team.   1. Which medications need to be refilled? (please list name of each medication and dose if known) amLODipine  (NORVASC ) 5 MG tablet  labetalol  (NORMODYNE ) 200 MG tablet  losartan  (COZAAR ) 50 MG tablet   2. Which pharmacy/location (including street and city if local pharmacy) is medication to be sent to? [facility] [id number] - [place], [place] - [address]   3. Do they need a 30 day or 90 day supply? 90

## 2024-05-04 NOTE — Telephone Encounter (Signed)
 RX sent to requested Pharmacy

## 2024-06-09 ENCOUNTER — Ambulatory Visit: Admitting: Physician Assistant

## 2024-06-16 ENCOUNTER — Other Ambulatory Visit: Payer: Self-pay | Admitting: Physician Assistant

## 2024-07-16 ENCOUNTER — Ambulatory Visit: Admitting: Internal Medicine

## 2024-07-29 ENCOUNTER — Other Ambulatory Visit: Payer: Self-pay | Admitting: Internal Medicine

## 2024-07-30 ENCOUNTER — Encounter: Payer: Self-pay | Admitting: Internal Medicine

## 2024-07-30 ENCOUNTER — Ambulatory Visit: Attending: Internal Medicine | Admitting: Internal Medicine

## 2024-07-30 VITALS — BP 122/70 | HR 77 | Ht 64.0 in | Wt 136.1 lb

## 2024-07-30 DIAGNOSIS — R002 Palpitations: Secondary | ICD-10-CM | POA: Insufficient documentation

## 2024-07-30 NOTE — Patient Instructions (Signed)
 Medication Instructions:  Your physician recommends that you continue on your current medications as directed. Please refer to the Current Medication list given to you today.  *If you need a refill on your cardiac medications before your next appointment, please call your pharmacy*  Lab Work: None ordered.  You may go to any [facility] Location for your lab work:  [facility] - [address] ([facility]) - [address] [phone] [address]  [place] - [address]   [place]  - [address]   [place] - [address] - [address] [employer]  - [address] [place] [address] ([facility]   If you have labs (blood work) drawn today and your tests are completely normal, you will receive your results only by: [employer] (if you have MyChart)  If you have any lab test that is abnormal or we need to change your treatment, we will call you or send a MyChart message to review the results.  Testing/Procedures: None ordered.  Follow-Up: At [facility], you and your health needs are our priority.  As part of our continuing mission to provide you with exceptional heart care, we have created designated Provider Care Teams.  These Care Teams include your primary Cardiologist (physician) and Advanced Practice Providers (APPs -  Physician Assistants and Nurse Practitioners) who all work together to provide you with the care you need, when you need it.  Your next appointment:   As needed

## 2024-07-30 NOTE — Progress Notes (Signed)
 HPI Sheri Martinez returns today for followup after a long absence from our EP clinic. She is a pleasant 68 yo woman with a h/o HTN and palpitations and chest pain with no obstructive CAD by cath years ago. She has developed dementia and is on both aricept  and namenda . She notes her palpitations are improved. At home her blood pressure is fairly well controlled.  She denies ETOH or caffeine excess. She cannot tell me what kind of work she is still doing.  No Active Allergies   Current Outpatient Medications  Medication Sig Dispense Refill   amLODipine  (NORVASC ) 5 MG tablet Take 1 tablet (5 mg total) by mouth daily. 90 tablet 0   aspirin  EC 81 MG tablet Take 81 mg by mouth daily.     donepezil  (ARICEPT ) 10 MG tablet Take 1 tablet (10 mg total) by mouth daily. 90 tablet 3   fexofenadine-pseudoephedrine (ALLEGRA-D) 60-120 MG 12 hr tablet Take 1 tablet by mouth 2 (two) times daily.     labetalol  (NORMODYNE ) 200 MG tablet Take 1 tablet (200 mg total) by mouth 2 (two) times daily. 180 tablet 0   losartan  (COZAAR ) 50 MG tablet Take 1 tablet (50 mg total) by mouth daily. 90 tablet 0   memantine  (NAMENDA ) 10 MG tablet Take 1 tablet  twice a day 180 tablet 3   memantine  (NAMENDA ) 5 MG tablet TAKE 1 TABLET BY MOUTH 2 TIMES A DAY 180 tablet 0   No current facility-administered medications for this visit.     Past Medical History:  Diagnosis Date   Abdominal pain, right upper quadrant 06/16/2009   Allergic rhinitis 03/03/2008   Allergy    Bronchitis 10/12/2011   just got over it'   Chest pain, unspecified 12/07/2009   Chronic gastritis    Complication of anesthesia    Concussion 11/2018   Dehydration, mild 05/09/2012   Diarrhea, infectious, adult 05/09/2012   Dysrhythmia    palpitation   Essential hypertension 03/03/2008   Generalized anxiety disorder    GERD (gastroesophageal reflux disease)    Hemorrhoids 06/16/2009   Hiatal hernia    Major neurocognitive disorder likely due to  Alzheimer's disease 11/09/2021   Myalgia 04/04/2010   Nausea 06/16/2009   Palpitations 02/02/2009   Pneumonia 10/12/2011   just got over it   PONV (postoperative nausea and vomiting)    S/P left knee arthroscopy 05/04/2013   Shortness of breath 10/12/2011   at any time; can't relate it to anything; here w/chest tightness; just got over pneumonia/bronchitis    ROS:   All systems reviewed and negative except as noted in the HPI.   Past Surgical History:  Procedure Laterality Date   ABDOMINAL HYSTERECTOMY  ~ 1989   TAH; BSO; due to endometriosis   CHOLECYSTECTOMY  02/13/2011   COLONOSCOPY     DILATION AND CURETTAGE OF UTERUS  06/16/1979   NASAL SEPTUM SURGERY  06/15/1989   OOPHORECTOMY     (tah/bso) (off estrogen 2005)     Family History  Problem Relation Age of Onset   Colon cancer Mother    Hypertension Mother    Heart disease Other        grandparent   Ovarian cancer Other        aunt   Crohn's disease Neg Hx    Esophageal cancer Neg Hx    Rectal cancer Neg Hx    Stomach cancer Neg Hx    Ulcerative colitis Neg Hx  Social History   Socioeconomic History   Marital status: Married    Spouse name: Not on file   Number of children: 2   Years of education: 12   Highest education level: High school graduate  Occupational History   Occupation: Retired    Associate Professor: [employer]    Comment: HR support  Tobacco Use   Smoking status: Former    Current packs/day: 0.00    Average packs/day: 0.5 packs/day for 3.0 years (1.5 ttl pk-yrs)    Types: Cigarettes    Start date: 12/22/1975    Quit date: 12/22/1978    Years since quitting: 45.6    Passive exposure: Never   Smokeless tobacco: Never  Vaping Use   Vaping status: Never Used  Substance and Sexual Activity   Alcohol use: No   Drug use: No   Sexual activity: Yes  Other Topics Concern   Not on file  Social History Narrative   Daily caffeine   One story home   Right handed   Social Drivers of Health    Financial Resource Strain: Low Risk  (01/09/2023)   Overall Financial Resource Strain (CARDIA)    Difficulty of Paying Living Expenses: Not hard at all  Food Insecurity: No Food Insecurity (01/09/2023)   Hunger Vital Sign    Worried About Running Out of Food in the Last Year: Never true    Ran Out of Food in the Last Year: Never true  Transportation Needs: No Transportation Needs (01/09/2023)   PRAPARE - Administrator, Civil Service (Medical): No    Lack of Transportation (Non-Medical): No  Physical Activity: Insufficiently Active (01/09/2023)   Exercise Vital Sign    Days of Exercise per Week: 3 days    Minutes of Exercise per Session: 20 min  Stress: Stress Concern Present (01/09/2023)   [employer] of Occupational Health - Occupational Stress Questionnaire    Feeling of Stress : Very much  Social Connections: Socially Isolated (01/09/2023)   Social Connection and Isolation Panel    Frequency of Communication with Friends and Family: Once a week    Frequency of Social Gatherings with Friends and Family: Once a week    Attends Religious Services: Never    Database administrator or Organizations: No    Attends Banker Meetings: Never    Marital Status: Married  Catering manager Violence: Not At Risk (01/09/2023)   Humiliation, Afraid, Rape, and Kick questionnaire    Fear of Current or Ex-Partner: No    Emotionally Abused: No    Physically Abused: No    Sexually Abused: No     BP 122/70 (BP Location: Left Arm, Patient Position: Sitting, Cuff Size: Normal)   Pulse 77   Ht 5' 4 (1.626 m)   Wt 136 lb 1.6 oz (61.7 kg)   SpO2 98%   BMI 23.36 kg/m   Physical Exam:  Well appearing NAD HEENT: Unremarkable Neck:  No JVD, no thyromegally Lymphatics:  No adenopathy Back:  No CVA tenderness Lungs:  Clear HEART:  Regular rate rhythm, no murmurs, no rubs, no clicks Abd:  soft, positive bowel sounds, no organomegally, no rebound, no guarding Ext:  2  plus pulses, no edema, no cyanosis, no clubbing Skin:  No rashes no nodules Neuro:  CN II through XII intact, motor grossly intact  EKG - nsr   Assess/Plan: 1. Palpitations - her symptoms are well controlled on her current medical therapy. She will followup as needed. 2. HTN -  her blood pressure is up a bit. She will start checking it at home and recording. 3. Dementia - she will continue her namenda  and aricept .    Danelle Waddell HERO.D.

## 2024-08-31 ENCOUNTER — Other Ambulatory Visit: Payer: Self-pay | Admitting: Physician Assistant

## 2024-09-06 ENCOUNTER — Other Ambulatory Visit: Payer: Self-pay | Admitting: Internal Medicine

## 2024-09-11 ENCOUNTER — Other Ambulatory Visit: Payer: Self-pay | Admitting: Physician Assistant

## 2024-10-26 ENCOUNTER — Ambulatory Visit: Admitting: Physician Assistant

## 2024-10-30 NOTE — Progress Notes (Signed)
 "   Dementia likely due to Alzheimer's disease   Sheri Martinez is a very pleasant 69 y.o. 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 Patient is currently on donepezil  10 mg daily and memantine  to 10 mg twice daily. Patient was last seen on 04/22/24. Slight memory decline is noted. Discussed with her husband increasing donepezil , but will hold for now, due to side effects concerns. Patient is able to participate on ADLs and continues to drive  very short distances without difficulties. Mood is good . This patient is accompanied in the office by her husband  who supplements the history.  Previous records as well as any outside records available were reviewed prior to todays visit.   Follow up in 6 months Continue donepezil  10 mg daily and increase memantine  to 10 mg twice daily for broader coverage, side effects discussed Recommend good control of cardiovascular risk factors. Informed of elevated BP Continue to control mood as per PCP Recommend increasing socialization I.e adult day programs.  Monitor driving   Discussed the use of AI scribe software for clinical note transcription with the patient, who gave verbal consent to proceed.  History of Present Illness Sheri Martinez is a 69 year old female who presents with worsening memory issues. She is accompanied by her husband.  Since July, she has experienced a mild worsening of short-term memory according to her husband, with difficulties recalling recent conversations and new information. There is occasional confusion about basic functions, such as which bedroom to go to, though she can find the bathroom and kitchen without issue. Long-term memory shows minimal changes, with slight difficulty recognizing family members during gatherings. No disorientation at home or frequent misplacement of items in unusual places, though she occasionally loses items like the remote control or phone.  Her husband notes she has become slightly more withdrawn, preferring not to travel far, such as to [place], to visit family.  She sleeps well, typically 11 hours at night and sometimes naps during the day. No nightmares, vivid dreams, or sleep talking.  Her appetite is good, and she eats three meals a day without difficulty swallowing or choking. No headaches, vision problems, pain, or urinary and stool incontinence. She is able to dress and shower independently, though she sometimes needs reminders to shower.  She has limited her driving due to a previous incident of getting lost and now relies on her sister and husband for transportation. She is currently taking memantine  10 mg twice daily and donepezil  10 mg daily.  No hallucinations, seizures, worsening depression, or changes in personality. No issues with vision, pain, or incontinence. She is able to walk and exercise without difficulty.           06/21/2022    6:00 PM  MMSE - Mini Mental State Exam  Orientation to time 0  Orientation to Place 5  Registration 3  Attention/ Calculation 5  Recall 0  Language- name 2 objects 2  Language- repeat 1  Language- follow 3 step command 3  Language- read & follow direction 1  Write a sentence 1  Copy design 1  Total score 22      07/03/2021    8:00 AM  Montreal Cognitive Assessment   Visuospatial/ Executive (0/5) 5  Naming (0/3) 3  Attention: Read list of digits (0/2) 2  Attention: Read list of letters (0/1) 1  Attention: Serial 7 subtraction starting at 100 (0/3) 1  Language: Repeat phrase (0/2) 2  Language : Fluency (0/1) 1  Abstraction (0/2) 1  Delayed Recall (0/5) 0  Orientation (0/6) 2  Total 18  Adjusted Score (based on education) 19      Objective:    Neurological Exam:    VITALS:   Vitals:   11/02/24 1410 11/02/24 1456  BP: (!) 161/70 (!) 161/70  Pulse: 65   SpO2: 99%   Weight: 131 lb (59.4 kg)   Height: 5' 4 (1.626 m)     GEN:  The patient appears  stated age and is in NAD. HEENT:  Normocephalic, atraumatic.   Neurological examination:  General: NAD, well-groomed, appears stated age. Orientation: The patient is alert. Oriented to person, not to place or  date Cranial nerves: There is good facial symmetry.The speech is fluent and clear. No aphasia or dysarthria. Fund of knowledge is reduced. Recent and remote memory are impaired. Attention and concentration are reduced. Able to name objects and repeat phrases.  Hearing is intact to conversational tone.   Sensation: Sensation is intact to light touch throughout Motor: Strength is at least antigravity x4. DTR's 2/4 in UE/LE     Movement examination:  Tone: There is normal tone in the UE/LE Abnormal movements:  no tremor.  No myoclonus.  No asterixis.   Coordination:  There is no decremation with RAM's. Normal finger to nose  Gait and Station: The patient has no  difficulty arising out of a deep-seated chair without the use of the hands. The patient's stride length is good.  Gait is cautious and narrow.    Thank you for allowing us  the opportunity to participate in the care of this nice patient. Please do not hesitate to contact us  for any questions or concerns.   Total time spent on today's visit was 28 minutes dedicated to this patient today, preparing to see patient, examining the patient, ordering tests and/or medications and counseling the patient, documenting clinical information in the EHR or other health record, independently interpreting results and communicating results to the patient/family, discussing treatment and goals, answering patient's questions and coordinating care.  Cc:  Levora Reyes SAUNDERS, MD  Camie Sevin 11/02/2024 3:46 PM      "

## 2024-11-02 ENCOUNTER — Encounter: Payer: Self-pay | Admitting: Physician Assistant

## 2024-11-02 ENCOUNTER — Ambulatory Visit (INDEPENDENT_AMBULATORY_CARE_PROVIDER_SITE_OTHER): Admitting: Physician Assistant

## 2024-11-02 VITALS — BP 161/70 | HR 65 | Ht 64.0 in | Wt 131.0 lb

## 2024-11-02 DIAGNOSIS — G309 Alzheimer's disease, unspecified: Secondary | ICD-10-CM

## 2024-11-02 DIAGNOSIS — F0280 Dementia in other diseases classified elsewhere without behavioral disturbance: Secondary | ICD-10-CM

## 2024-11-02 MED ORDER — MEMANTINE HCL 10 MG PO TABS: ORAL_TABLET | ORAL | 3 refills | Status: AC

## 2024-11-02 MED ORDER — DONEPEZIL HCL 10 MG PO TABS: 10.0000 mg | ORAL_TABLET | Freq: Every day | ORAL | 1 refills | Status: DC

## 2024-11-02 MED ORDER — DONEPEZIL HCL 10 MG PO TABS: 10.0000 mg | ORAL_TABLET | Freq: Every day | ORAL | 3 refills | Status: AC

## 2024-11-02 NOTE — Patient Instructions (Addendum)
 It was a pleasure to see you today at our office.   Recommendations:  Follow up in 6  months Continue donepezil  10 mg daily Continue memantine  to 10 mg  2 times a day if tolerated Recommend Silver [employer]ne[employer] exercise program     Whom to call:  Memory  decline, memory medications: Call our office [phone]   For psychiatric meds, mood meds: Please have your primary care physician manage these medications.   Counseling regarding caregiver distress, including caregiver depression, anxiety and issues regarding community resources, adult day care programs, adult living facilities, or memory care questions:   Feel free to contact Misty Waddell Simmer, Social Worker at [phone]   For assessment of decision of mental capacity and competency:  Call Dr. Rosaline Nine, geriatric psychiatrist at [phone]  For guidance in geriatric dementia issues please call [facility] [phone]  For guidance regarding [facility] and if placement were needed at the facility, contact Nat Hock, Social Worker tel: [phone]  If you have any severe symptoms of a stroke, or other severe issues such as confusion,severe chills or fever, etc call 911 or go to the ER as you may need to be evaluated further   Feel free to visit Facebook page  [employer] for tips of how to care for people with memory problems.         RECOMMENDATIONS FOR ALL PATIENTS WITH MEMORY PROBLEMS: 1. Continue to exercise (Recommend 30 minutes of walking everyday, or 3 hours every week) 2. Increase social interactions - continue going to [place] and enjoy social gatherings with friends and family 3. Eat healthy, avoid fried foods and eat more fruits and vegetables 4. Maintain adequate blood pressure, blood sugar, and blood cholesterol level. Reducing the risk of stroke and cardiovascular disease also helps promoting better memory. 5. Avoid stressful situations. Live a simple life and avoid  aggravations. Organize your time and prepare for the next day in anticipation. 6. Sleep well, avoid any interruptions of sleep and avoid any distractions in the bedroom that may interfere with adequate sleep quality 7. Avoid sugar, avoid sweets as there is a strong link between excessive sugar intake, diabetes, and cognitive impairment We discussed the Mediterranean diet, which has been shown to help patients reduce the risk of progressive memory disorders and reduces cardiovascular risk. This includes eating fish, eat fruits and green leafy vegetables, nuts like almonds and hazelnuts, walnuts, and also use olive oil. Avoid fast foods and fried foods as much as possible. Avoid sweets and sugar as sugar use has been linked to worsening of memory function.  There is always a concern of gradual progression of memory problems. If this is the case, then we may need to adjust level of care according to patient needs. Support, both to the patient and caregiver, should then be put into place.    FALL PRECAUTIONS: Be cautious when walking. Scan the area for obstacles that may increase the risk of trips and falls. When getting up in the mornings, sit up at the edge of the bed for a few minutes before getting out of bed. Consider elevating the bed at the head end to avoid drop of blood pressure when getting up. Walk always in a well-lit room (use night lights in the walls). Avoid area rugs or power cords from appliances in the middle of the walkways. Use a walker or a cane if necessary and consider physical therapy for balance exercise. Get your eyesight checked regularly.  FINANCIAL OVERSIGHT:  Supervision, especially oversight when making financial decisions or transactions is also recommended.  HOME SAFETY: Consider the safety of the kitchen when operating appliances like stoves, microwave oven, and blender. Consider having supervision and share cooking responsibilities until no longer able to participate in  those. Accidents with firearms and other hazards in the house should be identified and addressed as well.   ABILITY TO BE LEFT ALONE: If patient is unable to contact 911 operator, consider using [employer], or when the need is there, arrange for someone to stay with patients. Smoking is a fire hazard, consider supervision or cessation. Risk of wandering should be assessed by caregiver and if detected at any point, supervision and safe proof recommendations should be instituted.  MEDICATION SUPERVISION: Inability to self-administer medication needs to be constantly addressed. Implement a mechanism to ensure safe administration of the medications.   DRIVING: Regarding driving, in patients with progressive memory problems, driving will be impaired. We advise to have someone else do the driving if trouble finding directions or if minor accidents are reported. Independent driving assessment is available to determine safety of driving.   If you are interested in the driving assessment, you can contact the following:  [employer] in [place] [phone]  [facility] [phone]  [facility] [phone]  [facility] [phone] or [phone]

## 2025-05-03 ENCOUNTER — Ambulatory Visit: Payer: Self-pay | Admitting: Physician Assistant
